# Patient Record
Sex: Female | Born: 1985 | Race: White | Hispanic: No | Marital: Single | Smoking: Former smoker
Health system: Southern US, Community
[De-identification: ages and names within clinical notes are randomized; demographics above are authoritative.]

## PROBLEM LIST (undated history)

## (undated) DIAGNOSIS — F909 Attention-deficit hyperactivity disorder, unspecified type: Secondary | ICD-10-CM

## (undated) DIAGNOSIS — J309 Allergic rhinitis, unspecified: Secondary | ICD-10-CM

## (undated) HISTORY — DX: Allergic rhinitis, unspecified: J30.9

## (undated) HISTORY — DX: Attention-deficit hyperactivity disorder, unspecified type: F90.9

---

## 2005-08-25 ENCOUNTER — Ambulatory Visit: Payer: Self-pay | Admitting: Internal Medicine

## 2014-06-30 ENCOUNTER — Ambulatory Visit
Admit: 2014-06-30 | Disposition: A | Discharge status: Home / Self Care | Payer: Self-pay | Attending: Primary Care | Admitting: Primary Care

## 2014-07-18 ENCOUNTER — Ambulatory Visit
Admit: 2014-07-18 | Disposition: A | Discharge status: Home / Self Care | Payer: Self-pay | Attending: Primary Care | Admitting: Primary Care

## 2014-08-12 ENCOUNTER — Other Ambulatory Visit: Payer: Self-pay | Admitting: Family Medicine

## 2014-08-12 DIAGNOSIS — Z3401 Encounter for supervision of normal first pregnancy, first trimester: Secondary | ICD-10-CM

## 2014-08-13 ENCOUNTER — Ambulatory Visit
Admission: RE | Admit: 2014-08-13 | Discharge: 2014-08-13 | Disposition: A | Discharge status: Home / Self Care | Payer: Self-pay | Source: Ambulatory Visit | Attending: Family Medicine | Admitting: Family Medicine

## 2014-08-13 ENCOUNTER — Ambulatory Visit: Payer: Self-pay

## 2014-08-13 DIAGNOSIS — Z3401 Encounter for supervision of normal first pregnancy, first trimester: Secondary | ICD-10-CM | POA: Insufficient documentation

## 2014-08-13 DIAGNOSIS — O3481 Maternal care for other abnormalities of pelvic organs, first trimester: Secondary | ICD-10-CM | POA: Insufficient documentation

## 2015-12-19 IMAGING — US US OB COMP LESS 14 WK
1 series · 13 of 28 positions shown · non-contrast
Comparison: Pelvic ultrasound dated June 30, 2014

CLINICAL DATA: Unsure dates; spotting and brown discharge for the
past 4 days.

EXAM:
OBSTETRIC <14 WK US AND TRANSVAGINAL OB US
TECHNIQUE: Both transabdominal and transvaginal ultrasound examinations were
performed for complete evaluation of the gestation as well as the
maternal uterus, adnexal regions, and pelvic cul-de-sac.
Transvaginal technique was performed to assess early pregnancy.

[Series 1: us ob comp less 14 wk · 0.23mm/px · 13 of 72 slices shown]
[im 3/72]
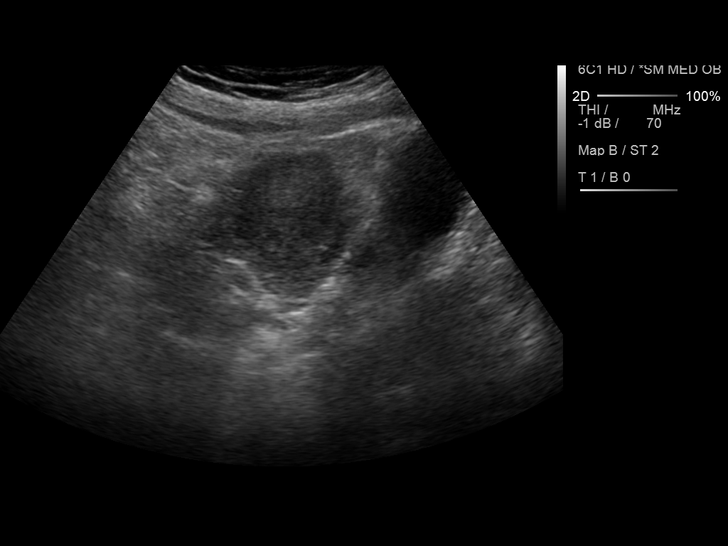
[im 8/72]
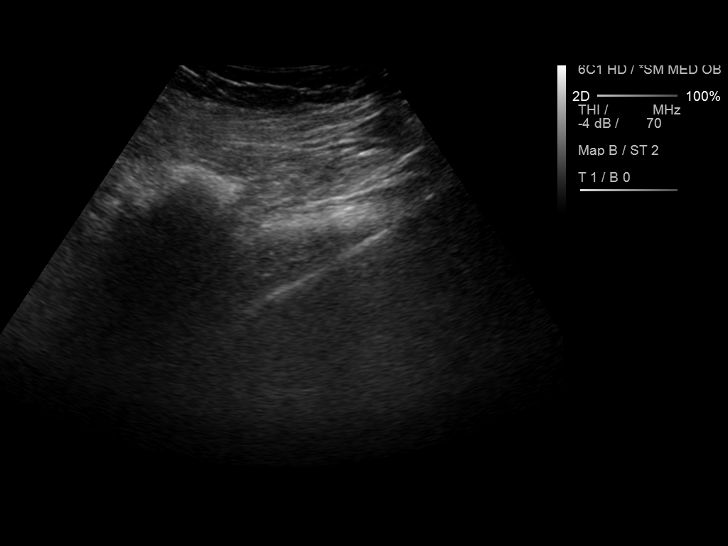
[im 14/72]
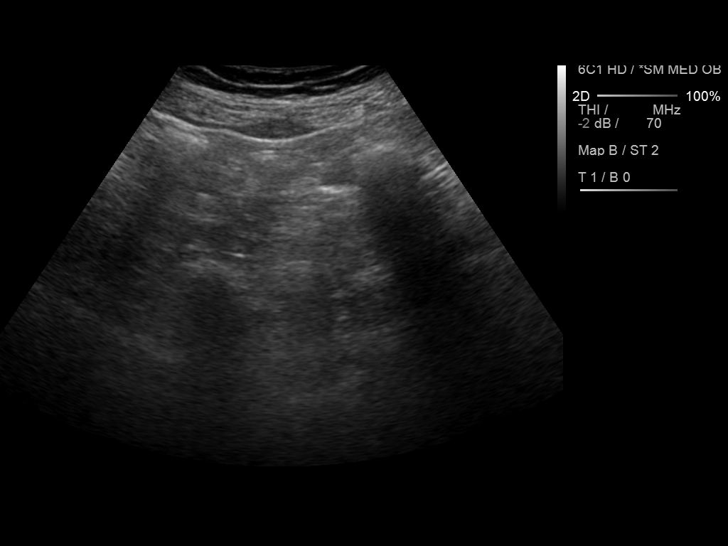
[im 19/72]
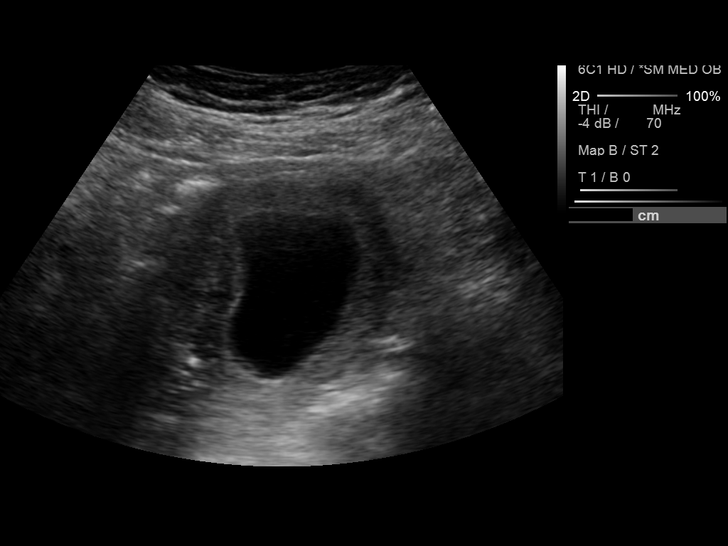
[im 24/72]
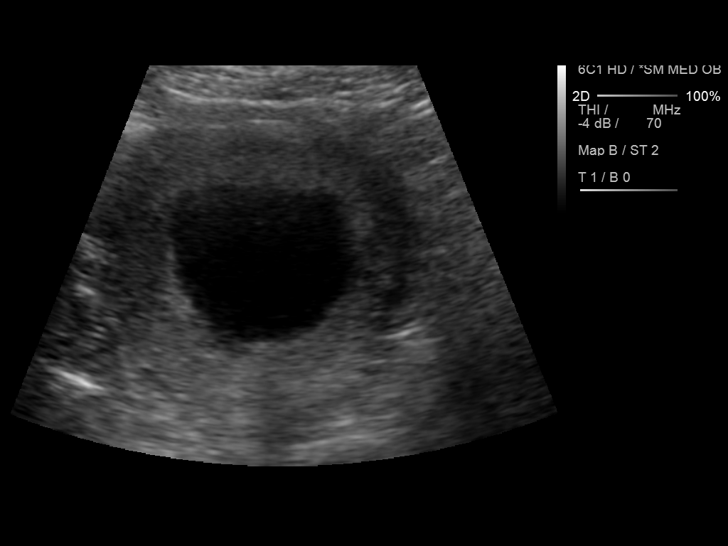
[im 29/72]
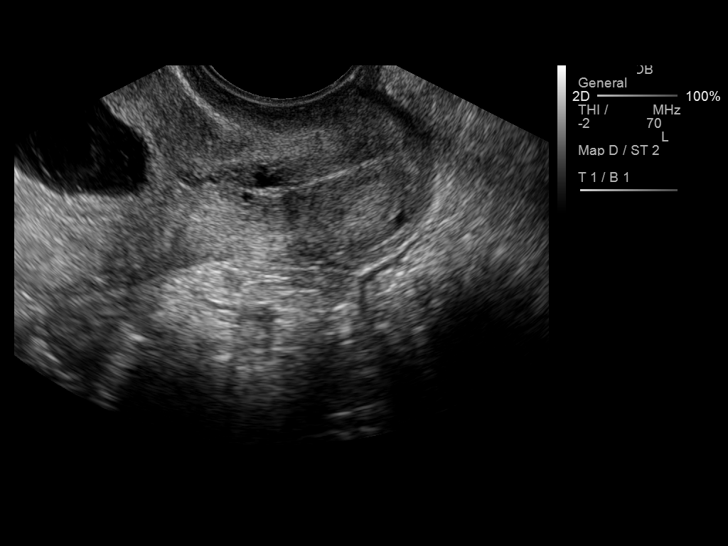
[im 37/72]
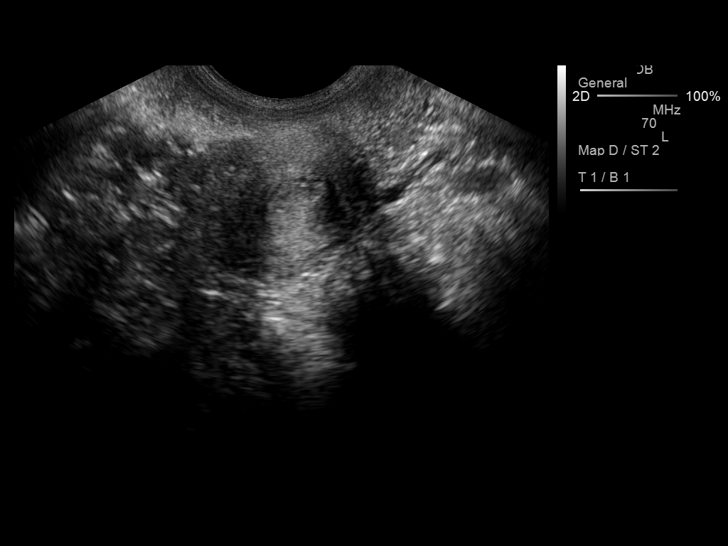
[im 43/72]
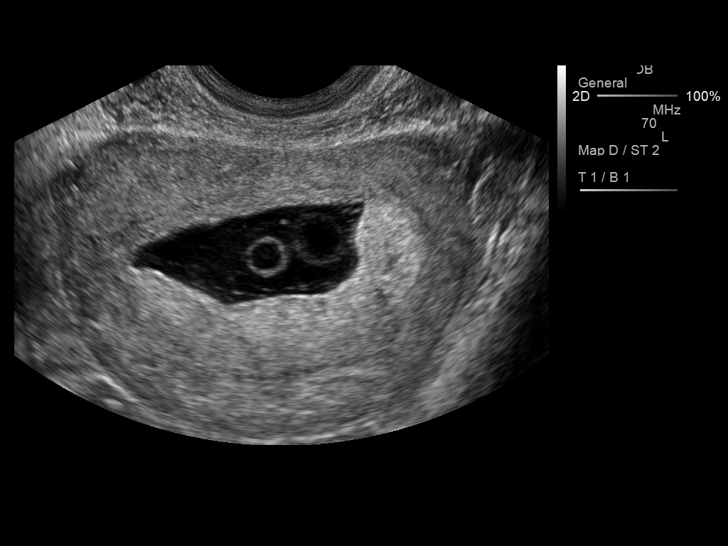
[im 48/72]
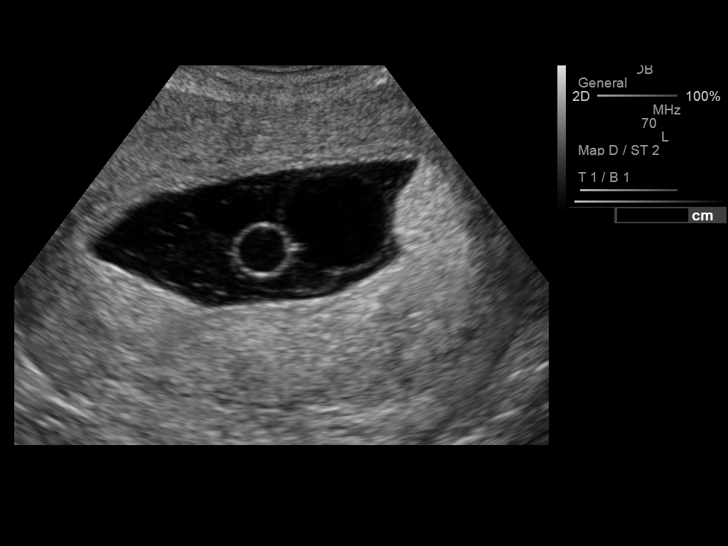
[im 53/72]
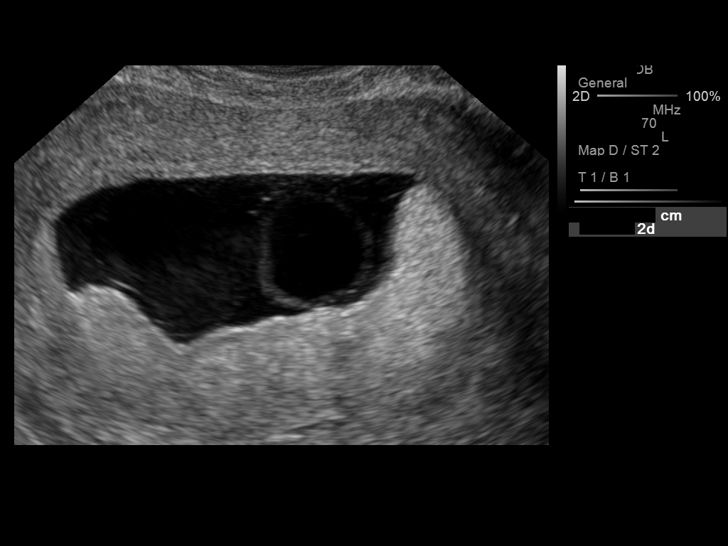
[im 58/72]
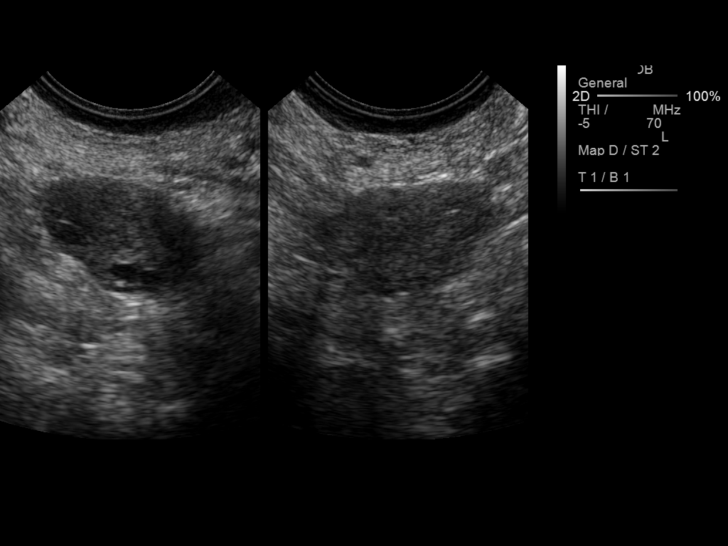
[im 64/72]
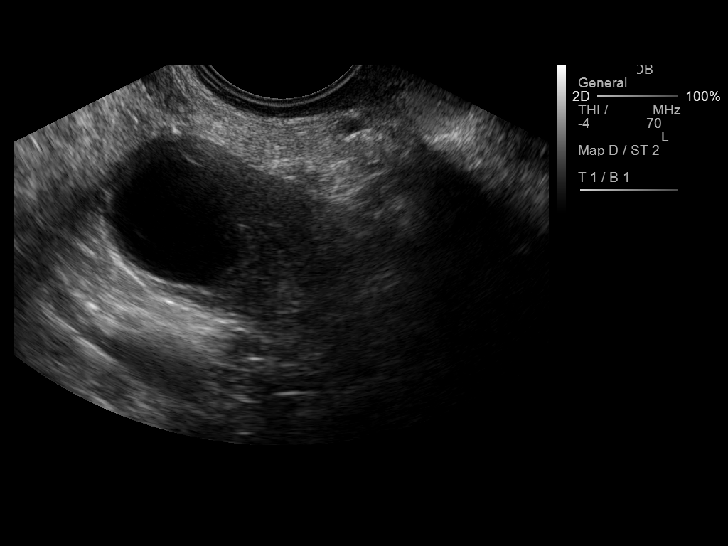
[im 69/72]
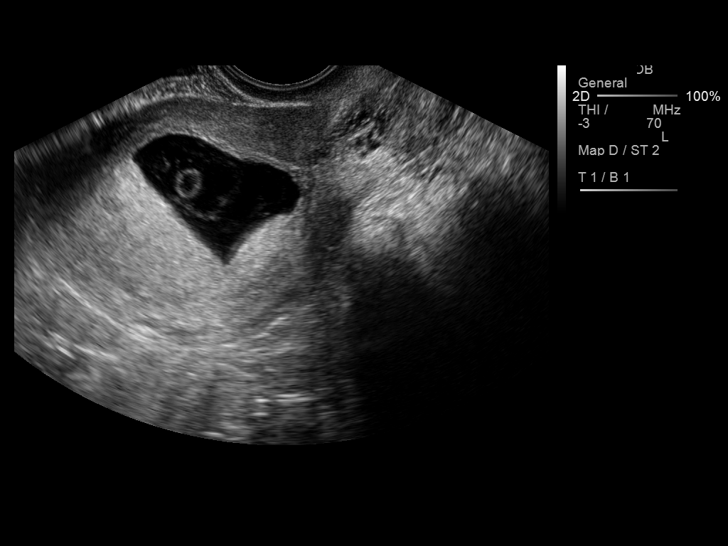

[13 of 28 positions shown; findings below may reference images not displayed]

FINDINGS: Intrauterine gestational sac: Single, somewhat irregular in shape

Yolk sac:  Present

Embryo:  Not visualized

Cardiac Activity: Not visualized

Heart Rate: n/a  bpm

MSD: 3.3  mm   8 w   3  d

US EDC: March 22, 2015

Maternal uterus/adnexae: There is no subchorionic hemorrhage. The
right ovary measures 3.6 x 2.3 x 2.5 cm. It contains a cyst
measuring 2.2 cm in greatest dimension. The left ovary measures
x 1.4 x 2.2 cm. There is no free pelvic fluid.
IMPRESSION: 1. No fetus is demonstrated in the presumed gestational sac. The
estimated gestational age would be approximately 8 weeks 3 days at
which time the fetus should certainly be visible. This may reflect
interval miscarriage since the previous study or may reflect an
anembryonic pregnancy.
2. There is a simple appearing right ovarian cyst. The left ovary is
unremarkable.
3. These results will be called to the ordering clinician or
representative by the [HOSPITAL] at the imaging location.

## 2016-03-21 NOTE — L&D Delivery Note (Signed)
Delivery Note   Carrie Schwartz is a 31 y.o. [id number] at 110w5d Estimated Date of Delivery: 02/17/17  PRE-OPERATIVE DIAGNOSIS:  1) 7310w5d pregnancy.   POST-OPERATIVE DIAGNOSIS:  1) 7510w5d pregnancy s/p Vaginal, Spontaneous Delivery   Delivery Type: Vaginal, Spontaneous Delivery    Delivery Anesthesia: Epidural   Labor Complications:   PPROM    ESTIMATED BLOOD LOSS: 350 ml    FINDINGS:   1) female infant, Apgar scores of 8    at 1 minute and 8    at 5 minutes and a birthweight of 112.17  ounces.    2) Nuchal cord: No  SPECIMENS:   PLACENTA:   Appearance: Intact , 3 vessel cord   Removal: Spontaneous      Disposition:   help per protocol then discarded  DISPOSITION:  Infant to left in stable condition in the delivery room, with L&D personnel and mother,  NARRATIVE SUMMARY: Labor course:  Ms. Carrie Quailnas Rastetter is a [id number] at 2410w5d who presented for induction of labor due to Preterm premature rupture of membranes .  She progressed well in labor with pitocin and cytotec.  She received the appropriate epidural anesthesia and proceeded to complete dilation. She evidenced good maternal expulsive effort during the second stage. She went on to deliver a viable female infant. The placenta delivered without problems and was noted to be complete. A perineal and vaginal examination was performed. Episiotomy/Lacerations: 2nd degree  , right side wall, left labial. The lacerations were repaired with 3-0 Vicryl rapide suture. The patient tolerated this well.  Doreene Burkennie Wilhelmine Krogstad, CNM 01/18/2017 10:30 PM

## 2016-08-05 ENCOUNTER — Encounter: Payer: Self-pay | Admitting: Certified Nurse Midwife

## 2016-08-05 ENCOUNTER — Ambulatory Visit (INDEPENDENT_AMBULATORY_CARE_PROVIDER_SITE_OTHER): Payer: No Typology Code available for payment source | Admitting: Certified Nurse Midwife

## 2016-08-05 VITALS — BP 101/75 | HR 84 | Ht 67.0 in | Wt 121.0 lb

## 2016-08-05 DIAGNOSIS — Z3490 Encounter for supervision of normal pregnancy, unspecified, unspecified trimester: Secondary | ICD-10-CM | POA: Insufficient documentation

## 2016-08-05 DIAGNOSIS — Z3A12 12 weeks gestation of pregnancy: Secondary | ICD-10-CM | POA: Diagnosis not present

## 2016-08-05 DIAGNOSIS — F429 Obsessive-compulsive disorder, unspecified: Secondary | ICD-10-CM | POA: Diagnosis not present

## 2016-08-05 DIAGNOSIS — Z3201 Encounter for pregnancy test, result positive: Secondary | ICD-10-CM | POA: Diagnosis not present

## 2016-08-05 NOTE — Patient Instructions (Addendum)
How a Baby Grows During Pregnancy Pregnancy begins when a female's sperm enters a female's egg (fertilization). This happens in one of the tubes (fallopian tubes) that connect the ovaries to the womb (uterus). The fertilized egg is called an embryo until it reaches 10 weeks. From 10 weeks until birth, it is called a fetus. The fertilized egg moves down the fallopian tube to the uterus. Then it implants into the lining of the uterus and begins to grow. The developing fetus receives oxygen and nutrients through the pregnant woman's bloodstream and the tissues that grow (placenta) to support the fetus. The placenta is the life support system for the fetus. It provides nutrition and removes waste. Learning as much as you can about your pregnancy and how your baby is developing can help you enjoy the experience. It can also make you aware of when there might be a problem and when to ask questions. How long does a typical pregnancy last? A pregnancy usually lasts 280 days, or about 40 weeks. Pregnancy is divided into three trimesters:  First trimester: 0-13 weeks.  Second trimester: 14-27 weeks.  Third trimester: 28-40 weeks. The day when your baby is considered ready to be born (full term) is your estimated date of delivery. How does my baby develop month by month? First month  The fertilized egg attaches to the inside of the uterus.  Some cells will form the placenta. Others will form the fetus.  The arms, legs, brain, spinal cord, lungs, and heart begin to develop.  At the end of the first month, the heart begins to beat. Second month  The bones, inner ear, eyelids, hands, and feet form.  The genitals develop.  By the end of 8 weeks, all major organs are developing. Third month  All of the internal organs are forming.  Teeth develop below the gums.  Bones and muscles begin to grow. The spine can flex.  The skin is transparent.  Fingernails and toenails begin to form.  Arms and  legs continue to grow longer, and hands and feet develop.  The fetus is about 3 in (7.6 cm) long. Fourth month  The placenta is completely formed.  The external sex organs, neck, outer ear, eyebrows, eyelids, and fingernails are formed.  The fetus can hear, swallow, and move its arms and legs.  The kidneys begin to produce urine.  The skin is covered with a white waxy coating (vernix) and very fine hair (lanugo). Fifth month  The fetus moves around more and can be felt for the first time (quickening).  The fetus starts to sleep and wake up and may begin to suck its finger.  The nails grow to the end of the fingers.  The organ in the digestive system that makes bile (gallbladder) functions and helps to digest the nutrients.  If your baby is a girl, eggs are present in her ovaries. If your baby is a boy, testicles start to move down into his scrotum. Sixth month  The lungs are formed, but the fetus is not yet able to breathe.  The eyes open. The brain continues to develop.  Your baby has fingerprints and toe prints. Your baby's hair grows thicker.  At the end of the second trimester, the fetus is about 9 in (22.9 cm) long. Seventh month  The fetus kicks and stretches.  The eyes are developed enough to sense changes in light.  The hands can make a grasping motion.  The fetus responds to sound. Eighth month  All organs and body systems are fully developed and functioning.  Bones harden and taste buds develop. The fetus may hiccup.  Certain areas of the brain are still developing. The skull remains soft. Ninth month  The fetus gains about  lb (0.23 kg) each week.  The lungs are fully developed.  Patterns of sleep develop.  The fetus's head typically moves into a head-down position (vertex) in the uterus to prepare for birth. If the buttocks move into a vertex position instead, the baby is breech.  The fetus weighs 6-9 lbs (2.72-4.08 kg) and is 19-20 in  (48.26-50.8 cm) long. What can I do to have a healthy pregnancy and help my baby develop?  Eating and Drinking  Eat a healthy diet.  Talk with your health care provider to make sure that you are getting the nutrients that you and your baby need.  Visit www.BuildDNA.es to learn about creating a healthy diet.  Gain a healthy amount of weight during pregnancy as advised by your health care provider. This is usually 25-35 pounds. You may need to:  Gain more if you were underweight before getting pregnant or if you are pregnant with more than one baby.  Gain less if you were overweight or obese when you got pregnant. Medicines and Vitamins  Take prenatal vitamins as directed by your health care provider. These include vitamins such as folic acid, iron, calcium, and vitamin D. They are important for healthy development.  Take medicines only as directed by your health care provider. Read labels and ask a pharmacist or your health care provider whether over-the-counter medicines, supplements, and prescription drugs are safe to take during pregnancy. Activities  Be physically active as advised by your health care provider. Ask your health care provider to recommend activities that are safe for you to do, such as walking or swimming.  Do not participate in strenuous or extreme sports. Lifestyle  Do not drink alcohol.  Do not use any tobacco products, including cigarettes, chewing tobacco, or electronic cigarettes. If you need help quitting, ask your health care provider.  Do not use illegal drugs. Safety  Avoid exposure to mercury, lead, or other heavy metals. Ask your health care provider about common sources of these heavy metals.  Avoid listeria infection during pregnancy. Follow these precautions:  Do not eat soft cheeses or deli meats.  Do not eat hot dogs unless they have been warmed up to the point of steaming, such as in the microwave oven.  Do not drink unpasteurized  milk.  Avoid toxoplasmosis infection during pregnancy. Follow these precautions:  Do not change your cat's litter box, if you have a cat. Ask someone else to do this for you.  Wear gardening gloves while working in the yard. General Instructions  Keep all follow-up visits as directed by your health care provider. This is important. This includes prenatal care and screening tests.  Manage any chronic health conditions. Work closely with your health care provider to keep conditions, such as diabetes, under control. How do I know if my baby is developing well? At each prenatal visit, your health care provider will do several different tests to check on your health and keep track of your baby's development. These include:  Fundal height.  Your health care provider will measure your growing belly from top to bottom using a tape measure.  Your health care provider will also feel your belly to determine your baby's position.  Heartbeat.  An ultrasound in the first trimester can  confirm pregnancy and show a heartbeat, depending on how far along you are.  Your health care provider will check your baby's heart rate at every prenatal visit.  As you get closer to your delivery date, you may have regular fetal heart rate monitoring to make sure that your baby is not in distress.  Second trimester ultrasound.  This ultrasound checks your baby's development. It also indicates your baby's gender. What should I do if I have concerns about my baby's development? Always talk with your health care provider about any concerns that you may have. This information is not intended to replace advice given to you by your health care provider. Make sure you discuss any questions you have with your health care provider. Document Released: 08/24/2007 Document Revised: 08/13/2015 Document Reviewed: 08/14/2013 Elsevier Interactive Patient Education  2017 [place]. Common Medications Safe in  Pregnancy  Acne:      Constipation:  Benzoyl Peroxide     Colace  Clindamycin      Dulcolax Suppository  Topica Erythromycin     Fibercon  Salicylic Acid      Metamucil         Miralax AVOID:        Senakot   Accutane    Cough:  Retin-A       Cough Drops  Tetracycline      Phenergan w/ Codeine if Rx  Minocycline      Robitussin (Plain & DM)  Antibiotics:     Crabs/Lice:  Ceclor       RID  Cephalosporins    AVOID:  E-Mycins      Kwell  Keflex  Macrobid/Macrodantin   Diarrhea:  Penicillin      Kao-Pectate  Zithromax      Imodium AD         PUSH FLUIDS AVOID:       Cipro     Fever:  Tetracycline      Tylenol (Regular or Extra  Minocycline       Strength)  Levaquin      Extra Strength-Do not          Exceed 8 tabs/24 hrs Caffeine:        '200mg'$ /day (equiv. To 1 cup of coffee or  approx. 3 12 oz sodas)         Gas: Cold/Hayfever:       Gas-X  Benadryl      Mylicon  Claritin       Phazyme  **Claritin-D        Chlor-Trimeton    Headaches:  Dimetapp      ASA-Free Excedrin  Drixoral-Non-Drowsy     Cold Compress  Mucinex (Guaifenasin)     Tylenol (Regular or Extra  Sudafed/Sudafed-12 Hour     Strength)  **Sudafed PE Pseudoephedrine   Tylenol Cold & Sinus     Vicks Vapor Rub  Zyrtec  **AVOID if Problems With Blood Pressure         Heartburn: Avoid lying down for at least 1 hour after meals  Aciphex      Maalox     Rash:  Milk of Magnesia     Benadryl    Mylanta       1% Hydrocortisone Cream  Pepcid  Pepcid Complete   Sleep Aids:  Prevacid      Ambien   Prilosec       Benadryl  Rolaids       Chamomile Tea  Tums (Limit 4/day)  Unisom  Zantac       Tylenol PM         Warm milk-add vanilla or  Hemorrhoids:       Sugar for taste  Anusol/Anusol H.C.  (RX: Analapram 2.5%)  Sugar Substitutes:  Hydrocortisone OTC     Ok in moderation  Preparation H      Tucks        Vaseline lotion applied to tissue with  wiping    Herpes:     Throat:  Acyclovir      Oragel  Famvir  Valtrex     Vaccines:         Flu Shot Leg Cramps:       *Gardasil  Benadryl      Hepatitis A         Hepatitis B Nasal Spray:       Pneumovax  Saline Nasal Spray     Polio Booster         Tetanus Nausea:       Tuberculosis test or PPD  Vitamin B6 25 mg TID   AVOID:    Dramamine      *Gardasil  Emetrol       Live Poliovirus  Ginger Root 250 mg QID    MMR (measles, mumps &  High Complex Carbs @ Bedtime    rebella)  Sea Bands-Accupressure    Varicella (Chickenpox)  Unisom 1/2 tab TID     *No known complications           If received before Pain:         Known pregnancy;   Darvocet       Resume series after  Lortab        Delivery  Percocet    Yeast:   Tramadol      Femstat  Tylenol 3      Gyne-lotrimin  Ultram       Monistat  Vicodin           MISC:         All Sunscreens           Hair Coloring/highlights          Insect Repellant's          (Including DEET)         Mystic Tans

## 2016-08-05 NOTE — Progress Notes (Signed)
Subjective:    Carrie Schwartz is a 31 y.o. female who presents for evaluation of amenorrhea. She believes she could be pregnant. Pregnancy is desired. Sexual Activity: single partner, contraception: none. Current symptoms also include: breast tenderness and nausea. Last period was normal.   Patient's last menstrual period was 05/13/2016 (exact date). The following portions of the patient's history were reviewed and updated as appropriate: allergies, current medications, past family history, past medical history, past social history, past surgical history and problem list.  Review of Systems Pertinent items are noted in HPI.     Objective:    LMP 05/13/2016 (Exact Date)  General: alert, cooperative, appears stated age and no acute distress    Lab Review Urine HCG: positive    Assessment:    Absence of menstruation.     Plan:    Pregnancy Test:   Positive: EDC: 02/18/16. Briefly discussed pre-natal care options.Encouraged well-balanced diet, plenty of rest when needed, pre-natal vitamins daily and walking for exercise. Discussed self-help for nausea, avoiding OTC medications, reviewed current medications encourage  minimal caffeine (1-2 cups daily) and avoiding alcohol. She will schedule her initial nurse visit ASAP and new OB physical exam next week.  Fetal heart tones today 170.   Doreene BurkeAnnie Alissandra Geoffroy, CNM

## 2016-08-11 ENCOUNTER — Encounter: Payer: Self-pay | Admitting: Obstetrics and Gynecology

## 2016-08-11 ENCOUNTER — Ambulatory Visit (INDEPENDENT_AMBULATORY_CARE_PROVIDER_SITE_OTHER): Payer: No Typology Code available for payment source | Admitting: Obstetrics and Gynecology

## 2016-08-11 ENCOUNTER — Other Ambulatory Visit: Payer: Self-pay | Admitting: Obstetrics and Gynecology

## 2016-08-11 VITALS — BP 88/66 | HR 77 | Wt 121.4 lb

## 2016-08-11 DIAGNOSIS — Z3492 Encounter for supervision of normal pregnancy, unspecified, second trimester: Secondary | ICD-10-CM

## 2016-08-11 DIAGNOSIS — B9689 Other specified bacterial agents as the cause of diseases classified elsewhere: Secondary | ICD-10-CM

## 2016-08-11 DIAGNOSIS — N760 Acute vaginitis: Secondary | ICD-10-CM

## 2016-08-11 MED ORDER — METRONIDAZOLE 500 MG PO TABS: 500.0000 mg | ORAL_TABLET | Freq: Two times a day (BID) | ORAL | 0 refills | Status: DC

## 2016-08-11 NOTE — Progress Notes (Signed)
NEW OB HISTORY AND PHYSICAL  SUBJECTIVE:       Carrie Schwartz is a 31 y.o. [id number] female, Patient's last menstrual period was 05/13/2016 (exact date)., Estimated Date of Delivery: 02/17/17, 7766w6d, presents today for establishment of Prenatal Care. She has no unusual complaints and complains of nausea without vomiting. 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      Gynecologic History Patient's last menstrual period was 05/13/2016 (exact date). Normal Contraception: none Last Pap: 2016. Results were: normal  Obstetric History OB History  Gravida Para Term Preterm AB Living  2       1    SAB TAB Ectopic Multiple Live Births  1            # Outcome Date GA Lbr Len/2nd Weight Sex Delivery Anes PTL Lv  2 Current           1 SAB  1430w0d    SAB         Past Medical History:  Diagnosis Date  . ADHD     History reviewed. No pertinent surgical history.  Current Outpatient Prescriptions on File Prior to Visit  Medication Sig Dispense Refill  . amphetamine-dextroamphetamine (ADDERALL) 20 MG tablet TK 1 T PO TID PRN  0  . Prenatal Vit-Fe Fumarate-FA (MULTIVITAMIN-PRENATAL) 27-0.8 MG TABS tablet Take 1 tablet by mouth daily at 12 noon.     No current facility-administered medications on file prior to visit.     No Known Allergies  Social History   Social History  . Marital status: Single    Spouse name: N/A  . Number of children: N/A  . Years of education: N/A   Occupational History  . Not on file.   Social History Main Topics  . Smoking status: Former Smoker    Quit date: 03/21/2006  . Smokeless tobacco: Never Used  . Alcohol use No  . Drug use: No  . Sexual activity: Yes    Birth control/ protection: None   Other Topics Concern  . Not on file   Social History Narrative  . No narrative on file    Family History  Problem Relation Age of Onset  . Thrombocytopenia Mother   . Hypertension Father   . Hyperlipidemia Father   . Diabetes Neg Hx   . Stroke  Neg Hx     The following portions of the patient's history were reviewed and updated as appropriate: allergies, current medications, past OB history, past medical history, past surgical history, past family history, past social history, and problem list.    OBJECTIVE: Initial Physical Exam (New OB)  GENERAL APPEARANCE: alert, well appearing, in no apparent distress, oriented to person, place and time HEAD: normocephalic, atraumatic MOUTH: mucous membranes moist, pharynx normal without lesions and dental hygiene good THYROID: no thyromegaly or masses present BREASTS: no masses noted, no significant tenderness, no palpable axillary nodes, no skin changes LUNGS: clear to auscultation, no wheezes, rales or rhonchi, symmetric air entry HEART: regular rate and rhythm, no murmurs ABDOMEN: soft, nontender, nondistended, no abnormal masses, no epigastric pain, fundus not palpable and FHT present EXTREMITIES: no redness or tenderness in the calves or thighs SKIN: normal coloration and turgor, no rashes LYMPH NODES: no adenopathy palpable NEUROLOGIC: alert, oriented, normal speech, no focal findings or movement disorder noted  PELVIC EXAM EXTERNAL GENITALIA: normal appearing vulva with no masses, tenderness or lesions VAGINA: no abnormal discharge or lesions CERVIX: no lesions or cervical motion tenderness and discharge thin, green and  frothy,  Microscopic wet-mount exam shows clue cells, excessive bacteria. UTERUS: gravid and consistent with 13 weeks  ASSESSMENT: Normal pregnancy BV ADD  PLAN: Prenatal care Desires genetic screening, will obtained today Treated for BV See orders

## 2016-08-11 NOTE — Progress Notes (Signed)
NOB - pt is doing well,having some nausea declines medication for it

## 2016-08-11 NOTE — Patient Instructions (Addendum)
Second Trimester of Pregnancy The second trimester is from week 14 through week 27 (months 4 through 6). The second trimester is often a time when you feel your best. Your body has adjusted to being pregnant, and you begin to feel better physically. Usually, morning sickness has lessened or quit completely, you may have more energy, and you may have an increase in appetite. The second trimester is also a time when the fetus is growing rapidly. At the end of the sixth month, the fetus is about 9 inches long and weighs about 1 pounds. You will likely begin to feel the baby move (quickening) between 16 and 20 weeks of pregnancy. Body changes during your second trimester Your body continues to go through many changes during your second trimester. The changes vary from woman to woman.  Your weight will continue to increase. You will notice your lower abdomen bulging out.  You may begin to get stretch marks on your hips, abdomen, and breasts.  You may develop headaches that can be relieved by medicines. The medicines should be approved by your health care provider.  You may urinate more often because the fetus is pressing on your bladder.  You may develop or continue to have heartburn as a result of your pregnancy.  You may develop constipation because certain hormones are causing the muscles that push waste through your intestines to slow down.  You may develop hemorrhoids or swollen, bulging veins (varicose veins).  You may have back pain. This is caused by:  Weight gain.  Pregnancy hormones that are relaxing the joints in your pelvis.  A shift in weight and the muscles that support your balance.  Your breasts will continue to grow and they will continue to become tender.  Your gums may bleed and may be sensitive to brushing and flossing.  Dark spots or blotches (chloasma, mask of pregnancy) may develop on your face. This will likely fade after the baby is born.  A dark line from your  belly button to the pubic area (linea nigra) may appear. This will likely fade after the baby is born.  You may have changes in your hair. These can include thickening of your hair, rapid growth, and changes in texture. Some women also have hair loss during or after pregnancy, or hair that feels dry or thin. Your hair will most likely return to normal after your baby is born. What to expect at prenatal visits During a routine prenatal visit:  You will be weighed to make sure you and the fetus are growing normally.  Your blood pressure will be taken.  Your abdomen will be measured to track your baby's growth.  The fetal heartbeat will be listened to.  Any test results from the previous visit will be discussed. Your health care provider may ask you:  How you are feeling.  If you are feeling the baby move.  If you have had any abnormal symptoms, such as leaking fluid, bleeding, severe headaches, or abdominal cramping.  If you are using any tobacco products, including cigarettes, chewing tobacco, and electronic cigarettes.  If you have any questions. Other tests that may be performed during your second trimester include:  Blood tests that check for:  Low iron levels (anemia).  High blood sugar that affects pregnant women (gestational diabetes) between 24 and 28 weeks.  Rh antibodies. This is to check for a protein on red blood cells (Rh factor).  Urine tests to check for infections, diabetes, or protein in the   urine.  An ultrasound to confirm the proper growth and development of the baby.  An amniocentesis to check for possible genetic problems.  Fetal screens for spina bifida and Down syndrome.  HIV (human immunodeficiency virus) testing. Routine prenatal testing includes screening for HIV, unless you choose not to have this test. Follow these instructions at home: Medicines   Follow your health care provider's instructions regarding medicine use. Specific medicines may  be either safe or unsafe to take during pregnancy.  Take a prenatal vitamin that contains at least 600 micrograms (mcg) of folic acid.  If you develop constipation, try taking a stool softener if your health care provider approves. Eating and drinking   Eat a balanced diet that includes fresh fruits and vegetables, whole grains, good sources of protein such as meat, eggs, or tofu, and low-fat dairy. Your health care provider will help you determine the amount of weight gain that is right for you.  Avoid raw meat and uncooked cheese. These carry germs that can cause birth defects in the baby.  If you have low calcium intake from food, talk to your health care provider about whether you should take a daily calcium supplement.  Limit foods that are high in fat and processed sugars, such as fried and sweet foods.  To prevent constipation:  Drink enough fluid to keep your urine clear or pale yellow.  Eat foods that are high in fiber, such as fresh fruits and vegetables, whole grains, and beans. Activity   Exercise only as directed by your health care provider. Most women can continue their usual exercise routine during pregnancy. Try to exercise for 30 minutes at least 5 days a week. Stop exercising if you experience uterine contractions.  Avoid heavy lifting, wear low heel shoes, and practice good posture.  A sexual relationship may be continued unless your health care provider directs you otherwise. Relieving pain and discomfort   Wear a good support bra to prevent discomfort from breast tenderness.  Take warm sitz baths to soothe any pain or discomfort caused by hemorrhoids. Use hemorrhoid cream if your health care provider approves.  Rest with your legs elevated if you have leg cramps or low back pain.  If you develop varicose veins, wear support hose. Elevate your feet for 15 minutes, 3-4 times a day. Limit salt in your diet. Prenatal Care   Write down your questions. Take them  to your prenatal visits.  Keep all your prenatal visits as told by your health care provider. This is important. Safety   Wear your seat belt at all times when driving.  Make a list of emergency phone numbers, including numbers for family, friends, the hospital, and police and fire departments. General instructions   Ask your health care provider for a referral to a local prenatal education class. Begin classes no later than the beginning of month 6 of your pregnancy.  Ask for help if you have counseling or nutritional needs during pregnancy. Your health care provider can offer advice or refer you to specialists for help with various needs.  Do not use hot tubs, steam rooms, or saunas.  Do not douche or use tampons or scented sanitary pads.  Do not cross your legs for long periods of time.  Avoid cat litter boxes and soil used by cats. These carry germs that can cause birth defects in the baby and possibly loss of the fetus by miscarriage or stillbirth.  Avoid all smoking, herbs, alcohol, and unprescribed drugs. Chemicals in   these products can affect the formation and growth of the baby.  Do not use any products that contain nicotine or tobacco, such as cigarettes and e-cigarettes. If you need help quitting, ask your health care provider.  Visit your dentist if you have not gone yet during your pregnancy. Use a soft toothbrush to brush your teeth and be gentle when you floss. Contact a health care provider if:  You have dizziness.  You have mild pelvic cramps, pelvic pressure, or nagging pain in the abdominal area.  You have persistent nausea, vomiting, or diarrhea.  You have a bad smelling vaginal discharge.  You have pain when you urinate. Get help right away if:  You have a fever.  You are leaking fluid from your vagina.  You have spotting or bleeding from your vagina.  You have severe abdominal cramping or pain.  You have rapid weight gain or weight loss.  You  have shortness of breath with chest pain.  You notice sudden or extreme swelling of your face, hands, ankles, feet, or legs.  You have not felt your baby move in over an hour.  You have severe headaches that do not go away when you take medicine.  You have vision changes. Summary  The second trimester is from week 14 through week 27 (months 4 through 6). It is also a time when the fetus is growing rapidly.  Your body goes through many changes during pregnancy. The changes vary from woman to woman.  Avoid all smoking, herbs, alcohol, and unprescribed drugs. These chemicals affect the formation and growth your baby.  Do not use any tobacco products, such as cigarettes, chewing tobacco, and e-cigarettes. If you need help quitting, ask your health care provider.  Contact your health care provider if you have any questions. Keep all prenatal visits as told by your health care provider. This is important. This information is not intended to replace advice given to you by your health care provider. Make sure you discuss any questions you have with your health care provider. Document Released: 03/01/2001 Document Revised: 08/13/2015 Document Reviewed: 05/08/2012 Elsevier Interactive Patient Education  2017 [employer]    Bacterial Vaginosis Bacterial vaginosis is a vaginal infection that occurs when the normal balance of bacteria in the vagina is disrupted. It results from an overgrowth of certain bacteria. This is the most common vaginal infection among women ages 59-44. Because bacterial vaginosis increases your risk for STIs (sexually transmitted infections), getting treated can help reduce your risk for chlamydia, gonorrhea, herpes, and HIV (human immunodeficiency virus). Treatment is also important for preventing complications in pregnant women, because this condition can cause an early (premature) delivery. What are the causes? This condition is caused by an increase in harmful  bacteria that are normally present in small amounts in the vagina. However, the reason that the condition develops is not fully understood. What increases the risk? The following factors may make you more likely to develop this condition:  Having a new sexual partner or multiple sexual partners.  Having unprotected sex.  Douching.  Having an intrauterine device (IUD).  Smoking.  Drug and alcohol abuse.  Taking certain antibiotic medicines.  Being pregnant. You cannot get bacterial vaginosis from toilet seats, bedding, swimming pools, or contact with objects around you. What are the signs or symptoms? Symptoms of this condition include:  Grey or white vaginal discharge. The discharge can also be watery or foamy.  A fish-like odor with discharge, especially after sexual intercourse or during  menstruation.  Itching in and around the vagina.  Burning or pain with urination. Some women with bacterial vaginosis have no signs or symptoms. How is this diagnosed? This condition is diagnosed based on:  Your medical history.  A physical exam of the vagina.  Testing a sample of vaginal fluid under a microscope to look for a large amount of bad bacteria or abnormal cells. Your health care provider may use a cotton swab or a small wooden spatula to collect the sample. How is this treated? This condition is treated with antibiotics. These may be given as a pill, a vaginal cream, or a medicine that is put into the vagina (suppository). If the condition comes back after treatment, a second round of antibiotics may be needed. Follow these instructions at home: Medicines   Take over-the-counter and prescription medicines only as told by your health care provider.  Take or use your antibiotic as told by your health care provider. Do not stop taking or using the antibiotic even if you start to feel better. General instructions   If you have a female sexual partner, tell her that you have  a vaginal infection. She should see her health care provider and be treated if she has symptoms. If you have a female sexual partner, he does not need treatment.  During treatment:  Avoid sexual activity until you finish treatment.  Do not douche.  Avoid alcohol as directed by your health care provider.  Avoid breastfeeding as directed by your health care provider.  Drink enough water and fluids to keep your urine clear or pale yellow.  Keep the area around your vagina and rectum clean.  Wash the area daily with warm water.  Wipe yourself from front to back after using the toilet.  Keep all follow-up visits as told by your health care provider. This is important. How is this prevented?  Do not douche.  Wash the outside of your vagina with warm water only.  Use protection when having sex. This includes latex condoms and dental dams.  Limit how many sexual partners you have. To help prevent bacterial vaginosis, it is best to have sex with just one partner (monogamous).  Make sure you and your sexual partner are tested for STIs.  Wear cotton or cotton-lined underwear.  Avoid wearing tight pants and pantyhose, especially during summer.  Limit the amount of alcohol that you drink.  Do not use any products that contain nicotine or tobacco, such as cigarettes and e-cigarettes. If you need help quitting, ask your health care provider.  Do not use illegal drugs. Where to find more information:  Centers for Disease Control and Prevention: SolutionApps.co.zawww.cdc.gov/std  American Sexual Health Association (ASHA): www.ashastd.org  [place] Department of Health and Health and safety inspectorHuman Services, Office on Women's Health: ConventionalMedicines.siwww.womenshealth.gov/ or http://www.anderson-williamson.info/https://www.womenshealth.gov/a-z-topics/bacterial-vaginosis Contact a health care provider if:  Your symptoms do not improve, even after treatment.  You have more discharge or pain when urinating.  You have a fever.  You have pain in your abdomen.  You have pain  during sex.  You have vaginal bleeding between periods. Summary  Bacterial vaginosis is a vaginal infection that occurs when the normal balance of bacteria in the vagina is disrupted.  Because bacterial vaginosis increases your risk for STIs (sexually transmitted infections), getting treated can help reduce your risk for chlamydia, gonorrhea, herpes, and HIV (human immunodeficiency virus). Treatment is also important for preventing complications in pregnant women, because the condition can cause an early (premature) delivery.  This condition is  treated with antibiotic medicines. These may be given as a pill, a vaginal cream, or a medicine that is put into the vagina (suppository). This information is not intended to replace advice given to you by your health care provider. Make sure you discuss any questions you have with your health care provider. Document Released: 03/07/2005 Document Revised: 11/21/2015 Document Reviewed: 11/21/2015 Elsevier Interactive Patient Education  2017 [employer].

## 2016-08-12 LAB — CBC WITH DIFFERENTIAL/PLATELET
BASOS ABS: 0 10*3/uL (ref 0.0–0.2)
Basos: 0 %
EOS (ABSOLUTE): 0.2 10*3/uL (ref 0.0–0.4)
Eos: 3 %
HEMOGLOBIN: 11.8 g/dL (ref 11.1–15.9)
Hematocrit: 35.3 % (ref 34.0–46.6)
IMMATURE GRANS (ABS): 0 10*3/uL (ref 0.0–0.1)
IMMATURE GRANULOCYTES: 1 %
LYMPHS: 27 %
Lymphocytes Absolute: 1.6 10*3/uL (ref 0.7–3.1)
MCH: 27 pg (ref 26.6–33.0)
MCHC: 33.4 g/dL (ref 31.5–35.7)
MCV: 81 fL (ref 79–97)
MONOCYTES: 5 %
Monocytes Absolute: 0.3 10*3/uL (ref 0.1–0.9)
NEUTROS PCT: 64 %
Neutrophils Absolute: 3.9 10*3/uL (ref 1.4–7.0)
PLATELETS: 180 10*3/uL (ref 150–379)
RBC: 4.37 x10E6/uL (ref 3.77–5.28)
RDW: 14.1 % (ref 12.3–15.4)
WBC: 6.1 10*3/uL (ref 3.4–10.8)

## 2016-08-12 LAB — MONITOR DRUG PROFILE 14(MW)
AMPHETAMINE SCREEN URINE: NEGATIVE ng/mL
BARBITURATE SCREEN URINE: NEGATIVE ng/mL
BENZODIAZEPINE SCREEN, URINE: NEGATIVE ng/mL
BUPRENORPHINE, URINE: NEGATIVE ng/mL
CANNABINOIDS UR QL SCN: NEGATIVE ng/mL
Cocaine (Metab) Scrn, Ur: NEGATIVE ng/mL
Creatinine(Crt), U: 54.9 mg/dL (ref 20.0–300.0)
Fentanyl, Urine: NEGATIVE pg/mL
MEPERIDINE SCREEN, URINE: NEGATIVE ng/mL
METHADONE SCREEN, URINE: NEGATIVE ng/mL
OXYCODONE+OXYMORPHONE UR QL SCN: NEGATIVE ng/mL
Opiate Scrn, Ur: NEGATIVE ng/mL
PH UR, DRUG SCRN: 7.2 (ref 4.5–8.9)
PHENCYCLIDINE QUANTITATIVE URINE: NEGATIVE ng/mL
PROPOXYPHENE SCREEN URINE: NEGATIVE ng/mL
SPECIFIC GRAVITY: 1.02
Tramadol Screen, Urine: NEGATIVE ng/mL

## 2016-08-12 LAB — URINALYSIS, ROUTINE W REFLEX MICROSCOPIC
BILIRUBIN UA: NEGATIVE
Glucose, UA: NEGATIVE
KETONES UA: NEGATIVE
LEUKOCYTES UA: NEGATIVE
Nitrite, UA: NEGATIVE
PROTEIN UA: NEGATIVE
RBC UA: NEGATIVE
SPEC GRAV UA: 1.015 (ref 1.005–1.030)
Urobilinogen, Ur: 0.2 mg/dL (ref 0.2–1.0)
pH, UA: 7.5 (ref 5.0–7.5)

## 2016-08-12 LAB — RUBELLA SCREEN: Rubella Antibodies, IGG: 5.01 index (ref >0.99)

## 2016-08-12 LAB — HEPATITIS B SURFACE ANTIGEN: HEP B S AG: NEGATIVE

## 2016-08-12 LAB — HIV ANTIBODY (ROUTINE TESTING W REFLEX): HIV SCREEN 4TH GENERATION: NONREACTIVE

## 2016-08-12 LAB — VARICELLA ZOSTER ANTIBODY, IGG: Varicella zoster IgG: 477 index (ref >165)

## 2016-08-12 LAB — ABO AND RH: RH TYPE: POSITIVE

## 2016-08-12 LAB — RPR: RPR Ser Ql: NONREACTIVE

## 2016-08-12 LAB — ANTIBODY SCREEN: ANTIBODY SCREEN: NEGATIVE

## 2016-08-12 LAB — CYTOLOGY - PAP

## 2016-08-13 LAB — URINE CULTURE: Organism ID, Bacteria: NO GROWTH

## 2016-08-14 LAB — GC/CHLAMYDIA PROBE AMP
Chlamydia trachomatis, NAA: NEGATIVE
Neisseria gonorrhoeae by PCR: NEGATIVE

## 2016-09-12 ENCOUNTER — Encounter: Payer: Self-pay | Admitting: Certified Nurse Midwife

## 2016-09-12 ENCOUNTER — Ambulatory Visit (INDEPENDENT_AMBULATORY_CARE_PROVIDER_SITE_OTHER): Payer: No Typology Code available for payment source | Admitting: Certified Nurse Midwife

## 2016-09-12 VITALS — BP 94/70 | HR 76 | Wt 125.3 lb

## 2016-09-12 DIAGNOSIS — Z3689 Encounter for other specified antenatal screening: Secondary | ICD-10-CM

## 2016-09-12 DIAGNOSIS — Z3492 Encounter for supervision of normal pregnancy, unspecified, second trimester: Secondary | ICD-10-CM

## 2016-09-12 DIAGNOSIS — Z87440 Personal history of urinary (tract) infections: Secondary | ICD-10-CM

## 2016-09-12 LAB — POCT URINALYSIS DIPSTICK
Bilirubin, UA: NEGATIVE
Glucose, UA: NEGATIVE
Ketones, UA: NEGATIVE
Leukocytes, UA: NEGATIVE
Nitrite, UA: NEGATIVE
PH UA: 7 (ref 5.0–8.0)
PROTEIN UA: NEGATIVE
Spec Grav, UA: 1.01 (ref 1.010–1.025)
UROBILINOGEN UA: 0.2 U/dL

## 2016-09-12 NOTE — Progress Notes (Signed)
Pt is here for an OB visit.No c/o 

## 2016-09-12 NOTE — Progress Notes (Signed)
ROB-Pt doing well, no questions or concerns. Anticipatory guidance regarding round ligament pain and home treatment measures. Pt requests repeat urine culture due to history of UTI and frequent urination. Reviewed red flag symptoms and when to call. RTC x 3 weeks for anatomy scan and ROB.

## 2016-09-12 NOTE — Patient Instructions (Signed)

## 2016-09-23 ENCOUNTER — Encounter: Payer: Self-pay | Admitting: Obstetrics and Gynecology

## 2016-09-27 ENCOUNTER — Other Ambulatory Visit: Payer: Self-pay | Admitting: Obstetrics and Gynecology

## 2016-09-27 MED ORDER — HYDROCORTISONE ACETATE 25 MG RE SUPP: 25.0000 mg | Freq: Two times a day (BID) | RECTAL | 1 refills | Status: DC

## 2016-10-03 ENCOUNTER — Ambulatory Visit (INDEPENDENT_AMBULATORY_CARE_PROVIDER_SITE_OTHER): Payer: No Typology Code available for payment source | Admitting: Certified Nurse Midwife

## 2016-10-03 ENCOUNTER — Ambulatory Visit (INDEPENDENT_AMBULATORY_CARE_PROVIDER_SITE_OTHER): Payer: No Typology Code available for payment source

## 2016-10-03 VITALS — BP 100/74 | HR 76 | Wt 134.1 lb

## 2016-10-03 DIAGNOSIS — Z3689 Encounter for other specified antenatal screening: Secondary | ICD-10-CM

## 2016-10-03 DIAGNOSIS — Z3482 Encounter for supervision of other normal pregnancy, second trimester: Secondary | ICD-10-CM

## 2016-10-03 DIAGNOSIS — Z3492 Encounter for supervision of normal pregnancy, unspecified, second trimester: Secondary | ICD-10-CM

## 2016-10-03 LAB — POCT URINALYSIS DIPSTICK
Bilirubin, UA: NEGATIVE
Glucose, UA: NEGATIVE
KETONES UA: NEGATIVE
Leukocytes, UA: NEGATIVE
Nitrite, UA: NEGATIVE
PH UA: 8 (ref 5.0–8.0)
PROTEIN UA: NEGATIVE
Urobilinogen, UA: 0.2 E.U./dL

## 2016-10-03 NOTE — Patient Instructions (Signed)
Hemorrhoids Hemorrhoids are swollen veins in and around the rectum or anus. There are two types of hemorrhoids:  Internal hemorrhoids. These occur in the veins that are just inside the rectum. They may poke through to the outside and become irritated and painful.  External hemorrhoids. These occur in the veins that are outside of the anus and can be felt as a painful swelling or hard lump near the anus.  Most hemorrhoids do not cause serious problems, and they can be managed with home treatments such as diet and lifestyle changes. If home treatments do not help your symptoms, procedures can be done to shrink or remove the hemorrhoids. What are the causes? This condition is caused by increased pressure in the anal area. This pressure may result from various things, including:  Constipation.  Straining to have a bowel movement.  Diarrhea.  Pregnancy.  Obesity.  Sitting for long periods of time.  Heavy lifting or other activity that causes you to strain.  Anal sex.  What are the signs or symptoms? Symptoms of this condition include:  Pain.  Anal itching or irritation.  Rectal bleeding.  Leakage of stool (feces).  Anal swelling.  One or more lumps around the anus.  How is this diagnosed? This condition can often be diagnosed through a visual exam. Other exams or tests may also be done, such as:  Examination of the rectal area with a gloved hand (digital rectal exam).  Examination of the anal canal using a small tube (anoscope).  A blood test, if you have lost a significant amount of blood.  A test to look inside the colon (sigmoidoscopy or colonoscopy).  How is this treated? This condition can usually be treated at home. However, various procedures may be done if dietary changes, lifestyle changes, and other home treatments do not help your symptoms. These procedures can help make the hemorrhoids smaller or remove them completely. Some of these procedures involve  surgery, and others do not. Common procedures include:  Rubber band ligation. Rubber bands are placed at the base of the hemorrhoids to cut off the blood supply to them.  Sclerotherapy. Medicine is injected into the hemorrhoids to shrink them.  Infrared coagulation. A type of light energy is used to get rid of the hemorrhoids.  Hemorrhoidectomy surgery. The hemorrhoids are surgically removed, and the veins that supply them are tied off.  Stapled hemorrhoidopexy surgery. A circular stapling device is used to remove the hemorrhoids and use staples to cut off the blood supply to them.  Follow these instructions at home: Eating and drinking  Eat foods that have a lot of fiber in them, such as whole grains, beans, nuts, fruits, and vegetables. Ask your health care provider about taking products that have added fiber (fiber supplements).  Drink enough fluid to keep your urine clear or pale yellow. Managing pain and swelling  Take warm sitz baths for 20 minutes, 3-4 times a day to ease pain and discomfort.  If directed, apply ice to the affected area. Using ice packs between sitz baths may be helpful. ? Put ice in a plastic bag. ? Place a towel between your skin and the bag. ? Leave the ice on for 20 minutes, 2-3 times a day. General instructions  Take over-the-counter and prescription medicines only as told by your health care provider.  Use medicated creams or suppositories as told.  Exercise regularly.  Go to the bathroom when you have the urge to have a bowel movement. Do not wait.    Avoid straining to have bowel movements.  Keep the anal area dry and clean. Use wet toilet paper or moist towelettes after a bowel movement.  Do not sit on the toilet for long periods of time. This increases blood pooling and pain. Contact a health care provider if:  You have increasing pain and swelling that are not controlled by treatment or medicine.  You have uncontrolled bleeding.  You  have difficulty having a bowel movement, or you are unable to have a bowel movement.  You have pain or inflammation outside the area of the hemorrhoids. This information is not intended to replace advice given to you by your health care provider. Make sure you discuss any questions you have with your health care provider. Document Released: 03/04/2000 Document Revised: 08/05/2015 Document Reviewed: 11/19/2014 Elsevier Interactive Patient Education  2017 [employer] Round Ligament Pain The round ligament is a cord of muscle and tissue that helps to support the uterus. It can become a source of pain during pregnancy if it becomes stretched or twisted as the baby grows. The pain usually begins in the second trimester of pregnancy, and it can come and go until the baby is delivered. It is not a serious problem, and it does not cause harm to the baby. Round ligament pain is usually a short, sharp, and pinching pain, but it can also be a dull, lingering, and aching pain. The pain is felt in the lower side of the abdomen or in the groin. It usually starts deep in the groin and moves up to the outside of the hip area. Pain can occur with:  A sudden change in position.  Rolling over in bed.  Coughing or sneezing.  Physical activity.  Follow these instructions at home: Watch your condition for any changes. Take these steps to help with your pain:  When the pain starts, relax. Then try: ? Sitting down. ? Flexing your knees up to your abdomen. ? Lying on your side with one pillow under your abdomen and another pillow between your legs. ? Sitting in a warm bath for 15-20 minutes or until the pain goes away.  Take over-the-counter and prescription medicines only as told by your health care provider.  Move slowly when you sit and stand.  Avoid long walks if they cause pain.  Stop or lessen your physical activities if they cause pain.  Contact a health care provider if:  Your pain does not go  away with treatment.  You feel pain in your back that you did not have before.  Your medicine is not helping. Get help right away if:  You develop a fever or chills.  You develop uterine contractions.  You develop vaginal bleeding.  You develop nausea or vomiting.  You develop diarrhea.  You have pain when you urinate. This information is not intended to replace advice given to you by your health care provider. Make sure you discuss any questions you have with your health care provider. Document Released: 12/15/2007 Document Revised: 08/13/2015 Document Reviewed: 05/14/2014 Elsevier Interactive Patient Education  2018 [employer]. How a Baby Grows During Pregnancy Pregnancy begins when a female's sperm enters a female's egg (fertilization). This happens in one of the tubes (fallopian tubes) that connect the ovaries to the womb (uterus). The fertilized egg is called an embryo until it reaches 10 weeks. From 10 weeks until birth, it is called a fetus. The fertilized egg moves down the fallopian tube to the uterus. Then it implants into the  lining of the uterus and begins to grow. The developing fetus receives oxygen and nutrients through the pregnant woman's bloodstream and the tissues that grow (placenta) to support the fetus. The placenta is the life support system for the fetus. It provides nutrition and removes waste. Learning as much as you can about your pregnancy and how your baby is developing can help you enjoy the experience. It can also make you aware of when there might be a problem and when to ask questions. How long does a typical pregnancy last? A pregnancy usually lasts 280 days, or about 40 weeks. Pregnancy is divided into three trimesters:  First trimester: 0-13 weeks.  Second trimester: 14-27 weeks.  Third trimester: 28-40 weeks.  The day when your baby is considered ready to be born (full term) is your estimated date of delivery. How does my baby develop month by  month? First month  The fertilized egg attaches to the inside of the uterus.  Some cells will form the placenta. Others will form the fetus.  The arms, legs, brain, spinal cord, lungs, and heart begin to develop.  At the end of the first month, the heart begins to beat.  Second month  The bones, inner ear, eyelids, hands, and feet form.  The genitals develop.  By the end of 8 weeks, all major organs are developing.  Third month  All of the internal organs are forming.  Teeth develop below the gums.  Bones and muscles begin to grow. The spine can flex.  The skin is transparent.  Fingernails and toenails begin to form.  Arms and legs continue to grow longer, and hands and feet develop.  The fetus is about 3 in (7.6 cm) long.  Fourth month  The placenta is completely formed.  The external sex organs, neck, outer ear, eyebrows, eyelids, and fingernails are formed.  The fetus can hear, swallow, and move its arms and legs.  The kidneys begin to produce urine.  The skin is covered with a white waxy coating (vernix) and very fine hair (lanugo).  Fifth month  The fetus moves around more and can be felt for the first time (quickening).  The fetus starts to sleep and wake up and may begin to suck its finger.  The nails grow to the end of the fingers.  The organ in the digestive system that makes bile (gallbladder) functions and helps to digest the nutrients.  If your baby is a girl, eggs are present in her ovaries. If your baby is a boy, testicles start to move down into his scrotum.  Sixth month  The lungs are formed, but the fetus is not yet able to breathe.  The eyes open. The brain continues to develop.  Your baby has fingerprints and toe prints. Your baby's hair grows thicker.  At the end of the second trimester, the fetus is about 9 in (22.9 cm) long.  Seventh month  The fetus kicks and stretches.  The eyes are developed enough to sense changes in  light.  The hands can make a grasping motion.  The fetus responds to sound.  Eighth month  All organs and body systems are fully developed and functioning.  Bones harden and taste buds develop. The fetus may hiccup.  Certain areas of the brain are still developing. The skull remains soft.  Ninth month  The fetus gains about  lb (0.23 kg) each week.  The lungs are fully developed.  Patterns of sleep develop.  The fetus's head  typically moves into a head-down position (vertex) in the uterus to prepare for birth. If the buttocks move into a vertex position instead, the baby is breech.  The fetus weighs 6-9 lbs (2.72-4.08 kg) and is 19-20 in (48.26-50.8 cm) long.  What can I do to have a healthy pregnancy and help my baby develop? Eating and Drinking  Eat a healthy diet. ? Talk with your health care provider to make sure that you are getting the nutrients that you and your baby need. ? Visit www.[employer].be to learn about creating a healthy diet.  Gain a healthy amount of weight during pregnancy as advised by your health care provider. This is usually 25-35 pounds. You may need to: ? Gain more if you were underweight before getting pregnant or if you are pregnant with more than one baby. ? Gain less if you were overweight or obese when you got pregnant.  Medicines and Vitamins  Take prenatal vitamins as directed by your health care provider. These include vitamins such as folic acid, iron, calcium, and vitamin D. They are important for healthy development.  Take medicines only as directed by your health care provider. Read labels and ask a pharmacist or your health care provider whether over-the-counter medicines, supplements, and prescription drugs are safe to take during pregnancy.  Activities  Be physically active as advised by your health care provider. Ask your health care provider to recommend activities that are safe for you to do, such as walking or  swimming.  Do not participate in strenuous or extreme sports.  Lifestyle  Do not drink alcohol.  Do not use any tobacco products, including cigarettes, chewing tobacco, or electronic cigarettes. If you need help quitting, ask your health care provider.  Do not use illegal drugs.  Safety  Avoid exposure to mercury, lead, or other heavy metals. Ask your health care provider about common sources of these heavy metals.  Avoid listeria infection during pregnancy. Follow these precautions: ? Do not eat soft cheeses or deli meats. ? Do not eat hot dogs unless they have been warmed up to the point of steaming, such as in the microwave oven. ? Do not drink unpasteurized milk.  Avoid toxoplasmosis infection during pregnancy. Follow these precautions: ? Do not change your cat's litter box, if you have a cat. Ask someone else to do this for you. ? Wear gardening gloves while working in the yard.  General Instructions  Keep all follow-up visits as directed by your health care provider. This is important. This includes prenatal care and screening tests.  Manage any chronic health conditions. Work closely with your health care provider to keep conditions, such as diabetes, under control.  How do I know if my baby is developing well? At each prenatal visit, your health care provider will do several different tests to check on your health and keep track of your baby's development. These include:  Fundal height. ? Your health care provider will measure your growing belly from top to bottom using a tape measure. ? Your health care provider will also feel your belly to determine your baby's position.  Heartbeat. ? An ultrasound in the first trimester can confirm pregnancy and show a heartbeat, depending on how far along you are. ? Your health care provider will check your baby's heart rate at every prenatal visit. ? As you get closer to your delivery date, you may have regular fetal heart rate  monitoring to make sure that your baby is not in distress.  Second trimester ultrasound. ? This ultrasound checks your baby's development. It also indicates your baby's gender.  What should I do if I have concerns about my baby's development? Always talk with your health care provider about any concerns that you may have. This information is not intended to replace advice given to you by your health care provider. Make sure you discuss any questions you have with your health care provider. Document Released: 08/24/2007 Document Revised: 08/13/2015 Document Reviewed: 08/14/2013 Elsevier Interactive Patient Education  [employer].

## 2016-10-03 NOTE — Progress Notes (Signed)
ROB, doing well. She has hemorrhoids, reviewed self help management options. Pharmacy is supposed to be contacting us for prior authorization for her hydrocortisone cream. She said she will contact pharmacy today to clarify. She is feeling fetal movement. Denies contractions. Reviewed anatomy U/S results with her. See below. Follow up in 4 wks.   Doreene BurkeAnnie Donell Tomkins, CNM  ULTRASOUND REPORT  Location: [facility] Date of Service: 10/03/16  Indications:Anatomy U/S Findings:  Mason JimSingleton intrauterine pregnancy is visualized with FHR at 149 BPM. Biometrics give an (U/S) Gestational age of 32 1/7 weeks and an (U/S) EDD of 02/05/17; this correlates with the clinically established EDD of 02/17/17.  Fetal presentation is Vertex.  EFW: 494g (1lb 1oz). Placenta: Anterior, grade 0, 4.6 cm from internal os, central cord insert. AFI: Adequate with MVP of 5.1cm.  Anatomic survey is complete and normal; Gender - female  .   Ovaries are not seen Survey of the adnexa demonstrates no adnexal masses. There is no free peritoneal fluid in the cul de sac.  Impression: 1. 22 1/7 week Viable Singleton Intrauterine pregnancy by U/S. 2. (U/S) EDD is consistent with Clinically established (LMP) EDD of 02/17/17. 3. Normal Anatomy Scan  Recommendations: 1.Clinical correlation with the patient's History and Physical Exam.   Boyce MediciMaria E Hill

## 2016-10-13 ENCOUNTER — Encounter: Payer: Self-pay | Admitting: Certified Nurse Midwife

## 2016-11-01 ENCOUNTER — Ambulatory Visit (INDEPENDENT_AMBULATORY_CARE_PROVIDER_SITE_OTHER): Payer: No Typology Code available for payment source | Admitting: Obstetrics and Gynecology

## 2016-11-01 VITALS — BP 107/75 | HR 88 | Wt 142.9 lb

## 2016-11-01 DIAGNOSIS — Z3492 Encounter for supervision of normal pregnancy, unspecified, second trimester: Secondary | ICD-10-CM

## 2016-11-01 LAB — POCT URINALYSIS DIPSTICK
BILIRUBIN UA: NEGATIVE
Glucose, UA: NEGATIVE
KETONES UA: NEGATIVE
Leukocytes, UA: NEGATIVE
Nitrite, UA: NEGATIVE
Protein, UA: NEGATIVE
RBC UA: NEGATIVE
SPEC GRAV UA: 1.01 (ref 1.010–1.025)
UROBILINOGEN UA: 0.2 U/dL
pH, UA: 6 (ref 5.0–8.0)

## 2016-11-01 NOTE — Progress Notes (Signed)
ROB- states moderate lower abdomen cramping since waking up this morning, notes 3 episodes since being in the room-20 minutes. Had normal BM this morning and voiding without changes. Denies spotting or recent sex. FFN and NuSwab obtained. Placed on NST monitor for contractions and noted 2 30sec contractions in 35 minutes. Stated they felt less while on monitor. Discussed PTL s/s and precautions. glucola next visit.

## 2016-11-01 NOTE — Progress Notes (Signed)
ROB- pt is doing well 

## 2016-11-02 LAB — FETAL FIBRONECTIN: Fetal Fibronectin: NEGATIVE

## 2016-11-05 LAB — NUSWAB VAGINITIS PLUS (VG+)
CANDIDA ALBICANS, NAA: NEGATIVE
Candida glabrata, NAA: NEGATIVE
Chlamydia trachomatis, NAA: NEGATIVE
NEISSERIA GONORRHOEAE, NAA: NEGATIVE
TRICH VAG BY NAA: NEGATIVE

## 2016-11-29 ENCOUNTER — Ambulatory Visit (INDEPENDENT_AMBULATORY_CARE_PROVIDER_SITE_OTHER): Payer: No Typology Code available for payment source | Admitting: Certified Nurse Midwife

## 2016-11-29 ENCOUNTER — Other Ambulatory Visit: Payer: No Typology Code available for payment source

## 2016-11-29 VITALS — BP 102/71 | HR 66 | Wt 149.9 lb

## 2016-11-29 DIAGNOSIS — Z131 Encounter for screening for diabetes mellitus: Secondary | ICD-10-CM

## 2016-11-29 DIAGNOSIS — O99619 Diseases of the digestive system complicating pregnancy, unspecified trimester: Secondary | ICD-10-CM

## 2016-11-29 DIAGNOSIS — Z23 Encounter for immunization: Secondary | ICD-10-CM

## 2016-11-29 DIAGNOSIS — K219 Gastro-esophageal reflux disease without esophagitis: Secondary | ICD-10-CM

## 2016-11-29 DIAGNOSIS — Z3483 Encounter for supervision of other normal pregnancy, third trimester: Secondary | ICD-10-CM

## 2016-11-29 DIAGNOSIS — Z130 Encounter for screening for diseases of the blood and blood-forming organs and certain disorders involving the immune mechanism: Secondary | ICD-10-CM

## 2016-11-29 LAB — POCT URINALYSIS DIPSTICK
Bilirubin, UA: NEGATIVE
Blood, UA: NEGATIVE
Glucose, UA: NEGATIVE
Ketones, UA: NEGATIVE
Leukocytes, UA: NEGATIVE
Nitrite, UA: NEGATIVE
PROTEIN UA: NEGATIVE
Spec Grav, UA: 1.01 (ref 1.010–1.025)
UROBILINOGEN UA: 0.2 U/dL
pH, UA: 7 (ref 5.0–8.0)

## 2016-11-29 NOTE — Patient Instructions (Addendum)
Heartburn During Pregnancy Heartburn is pain or discomfort in the throat or chest. It may cause a burning feeling. It happens when stomach acid moves up into the tube that carries food from your mouth to your stomach (esophagus). Heartburn is common during pregnancy. It usually goes away or gets better after giving birth. Follow these instructions at home: Eating and drinking  Do not drink alcohol while you are pregnant.  Figure out which foods and beverages make you feel worse, and avoid them.  Beverages that you may want to avoid include: ? Coffee and tea (with or without caffeine). ? Energy drinks and sports drinks. ? Bubbly (carbonated) drinks or sodas. ? Citrus fruit juices.  Foods that you may want to avoid include: ? Chocolate and cocoa. ? Peppermint and mint flavorings. ? Garlic, onions, and horseradish. ? Spicy and acidic foods. These include peppers, chili powder, curry powder, vinegar, hot sauces, and barbecue sauce. ? Citrus fruits, such as oranges, lemons, and limes. ? Tomato-based foods, such as red sauce, chili, and salsa. ? Fried and fatty foods, such as donuts, french fries, potato chips, and high-fat dressings. ? High-fat meats, such as hot dogs, cold cuts, sausage, ham, and bacon. ? High-fat dairy items, such as whole milk, butter, and cheese.  Eat small meals often, instead of large meals.  Avoid drinking a lot of liquid with your meals.  Avoid eating meals during the 2-3 hours before you go to bed.  Avoid lying down right after you eat.  Do not exercise right after you eat. Medicines  Take over-the-counter and prescription medicines only as told by your doctor.  Do not take aspirin, ibuprofen, or other NSAIDs unless your doctor tells you to do that.  Your doctor may tell you to avoid medicines that have sodium bicarbonate in them. General instructions  If told, raise the head of your bed about 6 inches (15 cm). You can do this by putting blocks under  the legs. Sleeping with more pillows does not help with heartburn.  Do not use any products that contain nicotine or tobacco, such as cigarettes and e-cigarettes. If you need help quitting, ask your doctor.  Wear loose-fitting clothing.  Try to lower your stress, such as with yoga or meditation. If you need help, ask your doctor.  Stay at a healthy weight. If you are overweight, work with your doctor to safely lose weight.  Keep all follow-up visits as told by your doctor. This is important. Contact a doctor if:  You get new symptoms.  Your symptoms do not get better with treatment.  You have weight loss and you do not know why.  You have trouble swallowing.  You make loud sounds when you breathe (wheeze).  You have a cough that does not go away.  You have heartburn often for more than 2 weeks.  You feel sick to your stomach (nauseous), and this does not get better with treatment.  You are throwing up (vomiting), and this does not get better with treatment.  You have pain in your belly (abdomen). Get help right away if:  You have very bad chest pain that spreads to your arm, neck, or jaw.  You feel sweaty, dizzy, or light-headed.  You have trouble breathing.  You have pain when swallowing.  You throw up and your throw-up looks like blood or coffee grounds.  Your poop (stool) is bloody or black. This information is not intended to replace advice given to you by your health care provider.   Make sure you discuss any questions you have with your health care provider. Document Released: 04/09/2010 Document Revised: 11/23/2015 Document Reviewed: 11/23/2015 Elsevier Interactive Patient Education  2017 [employer] Cord Blood Banking Information Cord blood banking is the process of collecting and storing the blood that is in the umbilical cord and placenta at the time of delivery. This blood contains stem cells, which can be used to treat many blood diseases, immune system  disorders, and childhood cancers. Stem cells can also be used to research certain diseases and treatments. Many people who choose cord blood banking donate the blood. Donated blood can be used in lifesaving treatments or for research. Other people choose to store the blood privately. Blood that is stored privately can only be used with the person's permission. This option is often chosen if:  A family member needs a stem cell transplant.  The child is part of an ethnic minority.  The child was conceived through in vitro fertilization.  What should I look for in a blood bank? A blood bank is the organization that coordinates cord blood banking. Make sure the cord blood bank that you use:  Is accredited.  Is financially stable.  Handles a large volume of cord blood samples.  Has a procedure in place for transport and storage.  Allows you the option of transferring your cord blood sample.  Has a procedure in place if the bank goes out of business.  Clearly states all costs and limits to future costs.  People who choose to donate cord blood should not need to pay for blood banking. People who keep the blood for private use will need to pay for the first (initial) storage and pay a fee each year (annual fee). Other fees may also apply. What are the risks of cord blood banking? There are no health risks associated with cord blood banking. It is considered safe. How should I prepare? You must schedule this process at least 4-6 weeks before you will be giving birth. How is the blood collected? The blood is collected as soon as the baby has been delivered. Within 15 minutes of delivery, a health care provider will take these actions to collect the blood:  Clamp the umbilical cord at the top and bottom. This traps the blood in the umbilical cord.  Use a syringe or bag to collect the blood.  Insert needles into the placenta to collect (draw out) more blood.  What happens after the blood  is collected? After the blood has been collected:  The blood will be sent to a blood bank.  The blood will be tested for genetic problems and infectious diseases. If the blood tests positive for a genetic problem or a disease, someone will contact you and let you know.  The blood will be frozen.  If your child develops a genetic condition, immune system disorder, or cancer, you will be responsible for contacting the blood bank and letting them know. This information is not intended to replace advice given to you by your health care provider. Make sure you discuss any questions you have with your health care provider. Document Released: 08/25/2009 Document Revised: 08/13/2015 Document Reviewed: 08/25/2014 Elsevier Interactive Patient Education  2018 [employer]. Breastfeeding Deciding to breastfeed is one of the best choices you can make for you and your baby. A change in hormones during pregnancy causes your breast tissue to grow and increases the number and size of your milk ducts. These hormones also allow proteins, sugars, and  fats from your blood supply to make breast milk in your milk-producing glands. Hormones prevent breast milk from being released before your baby is born as well as prompt milk flow after birth. Once breastfeeding has begun, thoughts of your baby, as well as his or her sucking or crying, can stimulate the release of milk from your milk-producing glands. Benefits of breastfeeding For Your Baby  Your first milk (colostrum) helps your baby's digestive system function better.  There are antibodies in your milk that help your baby fight off infections.  Your baby has a lower incidence of asthma, allergies, and sudden infant death syndrome.  The nutrients in breast milk are better for your baby than infant formulas and are designed uniquely for your baby's needs.  Breast milk improves your baby's brain development.  Your baby is less likely to develop other  conditions, such as childhood obesity, asthma, or type 2 diabetes mellitus.  For You  Breastfeeding helps to create a very special bond between you and your baby.  Breastfeeding is convenient. Breast milk is always available at the correct temperature and costs nothing.  Breastfeeding helps to burn calories and helps you lose the weight gained during pregnancy.  Breastfeeding makes your uterus contract to its prepregnancy size faster and slows bleeding (lochia) after you give birth.  Breastfeeding helps to lower your risk of developing type 2 diabetes mellitus, osteoporosis, and breast or ovarian cancer later in life.  Signs that your baby is hungry Early Signs of Hunger  Increased alertness or activity.  Stretching.  Movement of the head from side to side.  Movement of the head and opening of the mouth when the corner of the mouth or cheek is stroked (rooting).  Increased sucking sounds, smacking lips, cooing, sighing, or squeaking.  Hand-to-mouth movements.  Increased sucking of fingers or hands.  Late Signs of Hunger  Fussing.  Intermittent crying.  Extreme Signs of Hunger Signs of extreme hunger will require calming and consoling before your baby will be able to breastfeed successfully. Do not wait for the following signs of extreme hunger to occur before you initiate breastfeeding:  Restlessness.  A loud, strong cry.  Screaming.  Breastfeeding basics Breastfeeding Initiation  Find a comfortable place to sit or lie down, with your neck and back well supported.  Place a pillow or rolled up blanket under your baby to bring him or her to the level of your breast (if you are seated). Nursing pillows are specially designed to help support your arms and your baby while you breastfeed.  Make sure that your baby's abdomen is facing your abdomen.  Gently massage your breast. With your fingertips, massage from your chest wall toward your nipple in a circular motion.  This encourages milk flow. You may need to continue this action during the feeding if your milk flows slowly.  Support your breast with 4 fingers underneath and your thumb above your nipple. Make sure your fingers are well away from your nipple and your baby's mouth.  Stroke your baby's lips gently with your finger or nipple.  When your baby's mouth is open wide enough, quickly bring your baby to your breast, placing your entire nipple and as much of the colored area around your nipple (areola) as possible into your baby's mouth. ? More areola should be visible above your baby's upper lip than below the lower lip. ? Your baby's tongue should be between his or her lower gum and your breast.  Ensure that your baby's  mouth is correctly positioned around your nipple (latched). Your baby's lips should create a seal on your breast and be turned out (everted).  It is common for your baby to suck about 2-3 minutes in order to start the flow of breast milk.  Latching Teaching your baby how to latch on to your breast properly is very important. An improper latch can cause nipple pain and decreased milk supply for you and poor weight gain in your baby. Also, if your baby is not latched onto your nipple properly, he or she may swallow some air during feeding. This can make your baby fussy. Burping your baby when you switch breasts during the feeding can help to get rid of the air. However, teaching your baby to latch on properly is still the best way to prevent fussiness from swallowing air while breastfeeding. Signs that your baby has successfully latched on to your nipple:  Silent tugging or silent sucking, without causing you pain.  Swallowing heard between every 3-4 sucks.  Muscle movement above and in front of his or her ears while sucking.  Signs that your baby has not successfully latched on to nipple:  Sucking sounds or smacking sounds from your baby while breastfeeding.  Nipple pain.  If  you think your baby has not latched on correctly, slip your finger into the corner of your baby's mouth to break the suction and place it between your baby's gums. Attempt breastfeeding initiation again. Signs of Successful Breastfeeding Signs from your baby:  A gradual decrease in the number of sucks or complete cessation of sucking.  Falling asleep.  Relaxation of his or her body.  Retention of a small amount of milk in his or her mouth.  Letting go of your breast by himself or herself.  Signs from you:  Breasts that have increased in firmness, weight, and size 1-3 hours after feeding.  Breasts that are softer immediately after breastfeeding.  Increased milk volume, as well as a change in milk consistency and color by the fifth day of breastfeeding.  Nipples that are not sore, cracked, or bleeding.  Signs That Your Pecola Leisure is Getting Enough Milk  Wetting at least 1-2 diapers during the first 24 hours after birth.  Wetting at least 5-6 diapers every 24 hours for the first week after birth. The urine should be clear or pale yellow by 5 days after birth.  Wetting 6-8 diapers every 24 hours as your baby continues to grow and develop.  At least 3 stools in a 24-hour period by age 30 days. The stool should be soft and yellow.  At least 3 stools in a 24-hour period by age 68 days. The stool should be seedy and yellow.  No loss of weight greater than 10% of birth weight during the first 73 days of age.  Average weight gain of 4-7 ounces (113-198 g) per week after age 38 days.  Consistent daily weight gain by age 30 days, without weight loss after the age of 2 weeks.  After a feeding, your baby may spit up a small amount. This is common. Breastfeeding frequency and duration Frequent feeding will help you make more milk and can prevent sore nipples and breast engorgement. Breastfeed when you feel the need to reduce the fullness of your breasts or when your baby shows signs of hunger. This  is called "breastfeeding on demand." Avoid introducing a pacifier to your baby while you are working to establish breastfeeding (the first 4-6 weeks after your baby  is born). After this time you may choose to use a pacifier. Research has shown that pacifier use during the first year of a baby's life decreases the risk of sudden infant death syndrome (SIDS). Allow your baby to feed on each breast as long as he or she wants. Breastfeed until your baby is finished feeding. When your baby unlatches or falls asleep while feeding from the first breast, offer the second breast. Because newborns are often sleepy in the first few weeks of life, you may need to awaken your baby to get him or her to feed. Breastfeeding times will vary from baby to baby. However, the following rules can serve as a guide to help you ensure that your baby is properly fed:  Newborns (babies 2 weeks of age or younger) may breastfeed every 1-3 hours.  Newborns should not go longer than 3 hours during the day or 5 hours during the night without breastfeeding.  You should breastfeed your baby a minimum of 8 times in a 24-hour period until you begin to introduce solid foods to your baby at around 71 months of age.  Breast milk pumping Pumping and storing breast milk allows you to ensure that your baby is exclusively fed your breast milk, even at times when you are unable to breastfeed. This is especially important if you are going back to work while you are still breastfeeding or when you are not able to be present during feedings. Your lactation consultant can give you guidelines on how long it is safe to store breast milk. A breast pump is a machine that allows you to pump milk from your breast into a sterile bottle. The pumped breast milk can then be stored in a refrigerator or freezer. Some breast pumps are operated by hand, while others use electricity. Ask your lactation consultant which type will work best for you. Breast pumps can be  purchased, but some hospitals and breastfeeding support groups lease breast pumps on a monthly basis. A lactation consultant can teach you how to hand express breast milk, if you prefer not to use a pump. Caring for your breasts while you breastfeed Nipples can become dry, cracked, and sore while breastfeeding. The following recommendations can help keep your breasts moisturized and healthy:  Avoid using soap on your nipples.  Wear a supportive bra. Although not required, special nursing bras and tank tops are designed to allow access to your breasts for breastfeeding without taking off your entire bra or top. Avoid wearing underwire-style bras or extremely tight bras.  Air dry your nipples for 3-27minutes after each feeding.  Use only cotton bra pads to absorb leaked breast milk. Leaking of breast milk between feedings is normal.  Use lanolin on your nipples after breastfeeding. Lanolin helps to maintain your skin's normal moisture barrier. If you use pure lanolin, you do not need to wash it off before feeding your baby again. Pure lanolin is not toxic to your baby. You may also hand express a few drops of breast milk and gently massage that milk into your nipples and allow the milk to air dry.  In the first few weeks after giving birth, some women experience extremely full breasts (engorgement). Engorgement can make your breasts feel heavy, warm, and tender to the touch. Engorgement peaks within 3-5 days after you give birth. The following recommendations can help ease engorgement:  Completely empty your breasts while breastfeeding or pumping. You may want to start by applying warm, moist heat (in the shower  or with warm water-soaked hand towels) just before feeding or pumping. This increases circulation and helps the milk flow. If your baby does not completely empty your breasts while breastfeeding, pump any extra milk after he or she is finished.  Wear a snug bra (nursing or regular) or tank  top for 1-2 days to signal your body to slightly decrease milk production.  Apply ice packs to your breasts, unless this is too uncomfortable for you.  Make sure that your baby is latched on and positioned properly while breastfeeding.  If engorgement persists after 48 hours of following these recommendations, contact your health care provider or a Advertising copywriter. Overall health care recommendations while breastfeeding  Eat healthy foods. Alternate between meals and snacks, eating 3 of each per day. Because what you eat affects your breast milk, some of the foods may make your baby more irritable than usual. Avoid eating these foods if you are sure that they are negatively affecting your baby.  Drink milk, fruit juice, and water to satisfy your thirst (about 10 glasses a day).  Rest often, relax, and continue to take your prenatal vitamins to prevent fatigue, stress, and anemia.  Continue breast self-awareness checks.  Avoid chewing and smoking tobacco. Chemicals from cigarettes that pass into breast milk and exposure to secondhand smoke may harm your baby.  Avoid alcohol and drug use, including marijuana. Some medicines that may be harmful to your baby can pass through breast milk. It is important to ask your health care provider before taking any medicine, including all over-the-counter and prescription medicine as well as vitamin and herbal supplements. It is possible to become pregnant while breastfeeding. If birth control is desired, ask your health care provider about options that will be safe for your baby. Contact a health care provider if:  You feel like you want to stop breastfeeding or have become frustrated with breastfeeding.  You have painful breasts or nipples.  Your nipples are cracked or bleeding.  Your breasts are red, tender, or warm.  You have a swollen area on either breast.  You have a fever or chills.  You have nausea or vomiting.  You have drainage  other than breast milk from your nipples.  Your breasts do not become full before feedings by the fifth day after you give birth.  You feel sad and depressed.  Your baby is too sleepy to eat well.  Your baby is having trouble sleeping.  Your baby is wetting less than 3 diapers in a 24-hour period.  Your baby has less than 3 stools in a 24-hour period.  Your baby's skin or the white part of his or her eyes becomes yellow.  Your baby is not gaining weight by 73 days of age. Get help right away if:  Your baby is overly tired (lethargic) and does not want to wake up and feed.  Your baby develops an unexplained fever. This information is not intended to replace advice given to you by your health care provider. Make sure you discuss any questions you have with your health care provider. Document Released: 03/07/2005 Document Revised: 08/19/2015 Document Reviewed: 08/29/2012 Elsevier Interactive Patient Education  2017 [employer]. Tdap Vaccine (Tetanus, Diphtheria and Pertussis): What You Need to Know 1. Why get vaccinated? Tetanus, diphtheria and pertussis are very serious diseases. Tdap vaccine can protect [place] from these diseases. And, Tdap vaccine given to pregnant women can protect newborn babies against pertussis. TETANUS (Lockjaw) is rare in the [place] today. It  causes painful muscle tightening and stiffness, usually all over the body.  It can lead to tightening of muscles in the head and neck so you can't open your mouth, swallow, or sometimes even breathe. Tetanus kills about 1 out of 10 people who are infected even after receiving the best medical care.  DIPHTHERIA is also rare in the [place] today. It can cause a thick coating to form in the back of the throat.  It can lead to breathing problems, heart failure, paralysis, and death.  PERTUSSIS (Whooping Cough) causes severe coughing spells, which can cause difficulty breathing, vomiting and disturbed sleep.  It  can also lead to weight loss, incontinence, and rib fractures. Up to 2 in 100 adolescents and 5 in 100 adults with pertussis are hospitalized or have complications, which could include pneumonia or death.  These diseases are caused by bacteria. Diphtheria and pertussis are spread from person to person through secretions from coughing or sneezing. Tetanus enters the body through cuts, scratches, or wounds. Before vaccines, as many as 200,000 cases of diphtheria, 200,000 cases of pertussis, and hundreds of cases of tetanus, were reported in the [place] each year. Since vaccination began, reports of cases for tetanus and diphtheria have dropped by about 99% and for pertussis by about 80%. 2. Tdap vaccine Tdap vaccine can protect adolescents and adults from tetanus, diphtheria, and pertussis. One dose of Tdap is routinely given at age 36 or 37. People who did not get Tdap at that age should get it as soon as possible. Tdap is especially important for healthcare professionals and anyone having close contact with a baby younger than 12 months. Pregnant women should get a dose of Tdap during every pregnancy, to protect the newborn from pertussis. Infants are most at risk for severe, life-threatening complications from pertussis. Another vaccine, called Td, protects against tetanus and diphtheria, but not pertussis. A Td booster should be given every 10 years. Tdap may be given as one of these boosters if you have never gotten Tdap before. Tdap may also be given after a severe cut or burn to prevent tetanus infection. Your doctor or the person giving you the vaccine can give you more information. Tdap may safely be given at the same time as other vaccines. 3. Some people should not get this vaccine  A person who has ever had a life-threatening allergic reaction after a previous dose of any diphtheria, tetanus or pertussis containing vaccine, OR has a severe allergy to any part of this vaccine, should not  get Tdap vaccine. Tell the person giving the vaccine about any severe allergies.  Anyone who had coma or long repeated seizures within 7 days after a childhood dose of DTP or DTaP, or a previous dose of Tdap, should not get Tdap, unless a cause other than the vaccine was found. They can still get Td.  Talk to your doctor if you: ? have seizures or another nervous system problem, ? had severe pain or swelling after any vaccine containing diphtheria, tetanus or pertussis, ? ever had a condition called Guillain-Barr Syndrome (GBS), ? aren't feeling well on the day the shot is scheduled. 4. Risks With any medicine, including vaccines, there is a chance of side effects. These are usually mild and go away on their own. Serious reactions are also possible but are rare. Most people who get Tdap vaccine do not have any problems with it. Mild problems following Tdap: (Did not interfere with activities)  Pain where the shot  was given (about 3 in 4 adolescents or 2 in 3 adults)  Redness or swelling where the shot was given (about 1 person in 5)  Mild fever of at least 100.88F (up to about 1 in 25 adolescents or 1 in 100 adults)  Headache (about 3 or 4 people in 10)  Tiredness (about 1 person in 3 or 4)  Nausea, vomiting, diarrhea, stomach ache (up to 1 in 4 adolescents or 1 in 10 adults)  Chills, sore joints (about 1 person in 10)  Body aches (about 1 person in 3 or 4)  Rash, swollen glands (uncommon)  Moderate problems following Tdap: (Interfered with activities, but did not require medical attention)  Pain where the shot was given (up to 1 in 5 or 6)  Redness or swelling where the shot was given (up to about 1 in 16 adolescents or 1 in 12 adults)  Fever over 102F (about 1 in 100 adolescents or 1 in 250 adults)  Headache (about 1 in 7 adolescents or 1 in 10 adults)  Nausea, vomiting, diarrhea, stomach ache (up to 1 or 3 people in 100)  Swelling of the entire arm where the shot  was given (up to about 1 in 500).  Severe problems following Tdap: (Unable to perform usual activities; required medical attention)  Swelling, severe pain, bleeding and redness in the arm where the shot was given (rare).  Problems that could happen after any vaccine:  People sometimes faint after a medical procedure, including vaccination. Sitting or lying down for about 15 minutes can help prevent fainting, and injuries caused by a fall. Tell your doctor if you feel dizzy, or have vision changes or ringing in the ears.  Some people get severe pain in the shoulder and have difficulty moving the arm where a shot was given. This happens very rarely.  Any medication can cause a severe allergic reaction. Such reactions from a vaccine are very rare, estimated at fewer than 1 in a million doses, and would happen within a few minutes to a few hours after the vaccination. As with any medicine, there is a very remote chance of a vaccine causing a serious injury or death. The safety of vaccines is always being monitored. For more information, visit: http://floyd.org/ 5. What if there is a serious problem? What should I look for? Look for anything that concerns you, such as signs of a severe allergic reaction, very high fever, or unusual behavior. Signs of a severe allergic reaction can include hives, swelling of the face and throat, difficulty breathing, a fast heartbeat, dizziness, and weakness. These would usually start a few minutes to a few hours after the vaccination. What should I do?  If you think it is a severe allergic reaction or other emergency that can't wait, call 9-1-1 or get the person to the nearest hospital. Otherwise, call your doctor.  Afterward, the reaction should be reported to the Vaccine Adverse Event Reporting System (VAERS). Your doctor might file this report, or you can do it yourself through the VAERS web site at www.vaers.LAgents.no, or by calling  1-(559)603-2575. ? VAERS does not give medical advice. 6. The National Vaccine Injury Compensation Program The [employer] Vaccine Injury Compensation Program (VICP) is a federal program that was created to compensate people who may have been injured by certain vaccines. Persons who believe they may have been injured by a vaccine can learn about the program and about filing a claim by calling 1-(754) 751-0042 or visiting the VICP website at  SpiritualWord.at. There is a time limit to file a claim for compensation. 7. How can I learn more?  Ask your doctor. He or she can give you the vaccine package insert or suggest other sources of information.  Call your local or state health department.  Contact the Centers for Disease Control and Prevention (CDC): ? Call 360 577 7329 (1-800-CDC-INFO) or ? Visit CDC's website at PicCapture.uy CDC Tdap Vaccine VIS (05/14/13) This information is not intended to replace advice given to you by your health care provider. Make sure you discuss any questions you have with your health care provider. Document Released: 09/06/2011 Document Revised: 11/26/2015 Document Reviewed: 11/26/2015 Elsevier Interactive Patient Education  [address] [place] Class  August 31, 2016  Wednesday 7:00p - 9:00p  [facility] [place], [place]  October 05, 2016  Wednesday 7:00p - 9:00p [facility] [place], [place]    November 09, 2016   Wednesday 7:00p - 9:000p [facility] [place], [place]  December 07, 2016  Wednesday  7:00p - 9:00p [facility] [place], [place]  January 04, 2017 Wednesday 7:00p - 9:00p [facility] [place], [place]  Interested in a waterbirth?  This informational class will help you discover whether waterbirth is the right fit for you.  Education about waterbirth itself, supplies you would need and how to assemble your support team is  what you can expect from this class.  Some obstetrical practices require this class in order to pursue a waterbirth.  (Not all obstetrical practices offer waterbirth check with your healthcare provider)  Register only the expectant mom, but you are encouraged to bring your partner to class!  Fees & Payment No fee  Register Online [email or web address] Search [facility] 2018 Prenatal Education Class Schedule Register at [email or web address] in the Classes & Resources Link or call Mardi Mainland at [phone] 9:00a-5:00p M-F  Childbirth Preparation Certified Childbirth Educators teach this 5 week course.  Expectant parents are encouraged to take this class in their 3rd trimester, completing it by their 35-36th week. Meets in [facility], Lower Level.  Mondays Thursdays  7:00-9:00 p 7:00-9:00 p  July 23 - August 20 July 19 - August 16  September 17 - October 15 September 6 -October 4  November 5 - December 3 October 25 - November 29   No Class on Thanksgiving Day -November 22  Childbirth Preparation Refresher Course For those who have previously attended Prepared Childbirth Preparation classes, this class in incorporated into the 3rd and 4th classes in the Monday night childbirth series.  Course meets in the [facility]. Lower Level from 7:00p - 9:00p  August 6 & 13  October 1 & 8  November 19 & 26   Weekend Childbirth Aundria Mems Classes are held Saturday & Sunday, 1:00 5:00p Course meets in [facility] [place] Level  August 4 & 5  November 3 & 4    The [address] Free tours are held on the third Sunday of each month at 3 pm.  The tour meets in the third floor waiting area and will take approximately 30 minutes.  Tours are also included in Childbirth class series as well as Brother/Sister class.  An online virtual tour can be seen at [email or web address]         Breastfeeding & Infant  Nutrition The course incorporates returning to work or school.  Breast milk collection and storage with basic breastfeeding and infant nutrition. This two-class course is held the  2nd and 3rd Tuesday of each month from 7:00 -9:00 pm.  Course meets in the [facility] Lower level  June 12 & 19 July-No Class  August 14 & 21 September 11 &18  September 11 & 18 October 9 &16  November 13 & 20 December 11 & 18   [employer] welcomes any mother for a social outing with other Moms to share experiences and challenges in an informal setting.  Meets the 1st Thursday and 3rd Thursday 11:30a-1:00 pm of each month in [facility] 3rd floor classroom.  No registration required.  Newborn Essentials This course covers bathing, diapering, swaddling and more with practice on lifelike dolls.  Participants will also learn safety tips and infant CPR (Not for certification).  It is held the 1st Wednesday of each month from 7:00p-9:00p in the [facility], Lower level.  June 6 July- No Class  August 1 September 5  October 3 November 7  December 7    Preparing Big Brother & Sister This one session course prepares children and their parents for the arrival of a new baby.  It is held on the 1st Tuesday of each month from 6:30p - 8:00p. Course meets in the [facility], Lower level.  July-No Class August 7  September 4 October 2  November 6 December 4   [place] [facility] [employer] This nationally acclaimed class helps expecting and new dads with the basic skills and confidence to bond with their infants, support their mates, and provide a safe and healthy home environment for their new family. Classes are held the 2nd Saturday of every month from 9:00a - 12:00 noon.  Course meets in the [facility] Lower level.  June 9 August 11  October 13 No Class in December  Third Trimester of Pregnancy The third trimester is from week 28 through week 40 (months 7 through 9). The third  trimester is a time when the unborn baby (fetus) is growing rapidly. At the end of the ninth month, the fetus is about 20 inches in length and weighs 6-10 pounds. Body changes during your third trimester Your body will continue to go through many changes during pregnancy. The changes vary from woman to woman. During the third trimester:  Your weight will continue to increase. You can expect to gain 25-35 pounds (11-16 kg) by the end of the pregnancy.  You may begin to get stretch marks on your hips, abdomen, and breasts.  You may urinate more often because the fetus is moving lower into your pelvis and pressing on your bladder.  You may develop or continue to have heartburn. This is caused by increased hormones that slow down muscles in the digestive tract.  You may develop or continue to have constipation because increased hormones slow digestion and cause the muscles that push waste through your intestines to relax.  You may develop hemorrhoids. These are swollen veins (varicose veins) in the rectum that can itch or be painful.  You may develop swollen, bulging veins (varicose veins) in your legs.  You may have increased body aches in the pelvis, back, or thighs. This is due to weight gain and increased hormones that are relaxing your joints.  You may have changes in your hair. These can include thickening of your hair, rapid growth, and changes in texture. Some women also have hair loss during or after pregnancy, or hair that feels dry or thin. Your hair will most likely return to normal  after your baby is born.  Your breasts will continue to grow and they will continue to become tender. A yellow fluid (colostrum) may leak from your breasts. This is the first milk you are producing for your baby.  Your belly button may stick out.  You may notice more swelling in your hands, face, or ankles.  You may have increased tingling or numbness in your hands, arms, and legs. The skin on your  belly may also feel numb.  You may feel short of breath because of your expanding uterus.  You may have more problems sleeping. This can be caused by the size of your belly, increased need to urinate, and an increase in your body's metabolism.  You may notice the fetus "dropping," or moving lower in your abdomen (lightening).  You may have increased vaginal discharge.  You may notice your joints feel loose and you may have pain around your pelvic bone.  What to expect at prenatal visits You will have prenatal exams every 2 weeks until week 36. Then you will have weekly prenatal exams. During a routine prenatal visit:  You will be weighed to make sure you and the baby are growing normally.  Your blood pressure will be taken.  Your abdomen will be measured to track your baby's growth.  The fetal heartbeat will be listened to.  Any test results from the previous visit will be discussed.  You may have a cervical check near your due date to see if your cervix has softened or thinned (effaced).  You will be tested for Group B streptococcus. This happens between 35 and 37 weeks.  Your health care provider may ask you:  What your birth plan is.  How you are feeling.  If you are feeling the baby move.  If you have had any abnormal symptoms, such as leaking fluid, bleeding, severe headaches, or abdominal cramping.  If you are using any tobacco products, including cigarettes, chewing tobacco, and electronic cigarettes.  If you have any questions.  Other tests or screenings that may be performed during your third trimester include:  Blood tests that check for low iron levels (anemia).  Fetal testing to check the health, activity level, and growth of the fetus. Testing is done if you have certain medical conditions or if there are problems during the pregnancy.  Nonstress test (NST). This test checks the health of your baby to make sure there are no signs of problems, such as the  baby not getting enough oxygen. During this test, a belt is placed around your belly. The baby is made to move, and its heart rate is monitored during movement.  What is false labor? False labor is a condition in which you feel small, irregular tightenings of the muscles in the womb (contractions) that usually go away with rest, changing position, or drinking water. These are called Braxton Hicks contractions. Contractions may last for hours, days, or even weeks before true labor sets in. If contractions come at regular intervals, become more frequent, increase in intensity, or become painful, you should see your health care provider. What are the signs of labor?  Abdominal cramps.  Regular contractions that start at 10 minutes apart and become stronger and more frequent with time.  Contractions that start on the top of the uterus and spread down to the lower abdomen and back.  Increased pelvic pressure and dull back pain.  A watery or bloody mucus discharge that comes from the vagina.  Leaking of amniotic  fluid. This is also known as your "water breaking." It could be a slow trickle or a gush. Let your health care provider know if it has a color or strange odor. If you have any of these signs, call your health care provider right away, even if it is before your due date. Follow these instructions at home: Medicines  Follow your health care provider's instructions regarding medicine use. Specific medicines may be either safe or unsafe to take during pregnancy.  Take a prenatal vitamin that contains at least 600 micrograms (mcg) of folic acid.  If you develop constipation, try taking a stool softener if your health care provider approves. Eating and drinking  Eat a balanced diet that includes fresh fruits and vegetables, whole grains, good sources of protein such as meat, eggs, or tofu, and low-fat dairy. Your health care provider will help you determine the amount of weight gain that is  right for you.  Avoid raw meat and uncooked cheese. These carry germs that can cause birth defects in the baby.  If you have low calcium intake from food, talk to your health care provider about whether you should take a daily calcium supplement.  Eat four or five small meals rather than three large meals a day.  Limit foods that are high in fat and processed sugars, such as fried and sweet foods.  To prevent constipation: ? Drink enough fluid to keep your urine clear or pale yellow. ? Eat foods that are high in fiber, such as fresh fruits and vegetables, whole grains, and beans. Activity  Exercise only as directed by your health care provider. Most women can continue their usual exercise routine during pregnancy. Try to exercise for 30 minutes at least 5 days a week. Stop exercising if you experience uterine contractions.  Avoid heavy lifting.  Do not exercise in extreme heat or humidity, or at high altitudes.  Wear low-heel, comfortable shoes.  Practice good posture.  You may continue to have sex unless your health care provider tells you otherwise. Relieving pain and discomfort  Take frequent breaks and rest with your legs elevated if you have leg cramps or low back pain.  Take warm sitz baths to soothe any pain or discomfort caused by hemorrhoids. Use hemorrhoid cream if your health care provider approves.  Wear a good support bra to prevent discomfort from breast tenderness.  If you develop varicose veins: ? Wear support pantyhose or compression stockings as told by your healthcare provider. ? Elevate your feet for 15 minutes, 3-4 times a day. Prenatal care  Write down your questions. Take them to your prenatal visits.  Keep all your prenatal visits as told by your health care provider. This is important. Safety  Wear your seat belt at all times when driving.  Make a list of emergency phone numbers, including numbers for family, friends, the hospital, and police and  fire departments. General instructions  Avoid cat litter boxes and soil used by cats. These carry germs that can cause birth defects in the baby. If you have a cat, ask someone to clean the litter box for you.  Do not travel far distances unless it is absolutely necessary and only with the approval of your health care provider.  Do not use hot tubs, steam rooms, or saunas.  Do not drink alcohol.  Do not use any products that contain nicotine or tobacco, such as cigarettes and e-cigarettes. If you need help quitting, ask your health care provider.  Do not  use any medicinal herbs or unprescribed drugs. These chemicals affect the formation and growth of the baby.  Do not douche or use tampons or scented sanitary pads.  Do not cross your legs for long periods of time.  To prepare for the arrival of your baby: ? Take prenatal classes to understand, practice, and ask questions about labor and delivery. ? Make a trial run to the hospital. ? Visit the hospital and tour the maternity area. ? Arrange for maternity or paternity leave through employers. ? Arrange for family and friends to take care of pets while you are in the hospital. ? Purchase a rear-facing car seat and make sure you know how to install it in your car. ? Pack your hospital bag. ? Prepare the baby's nursery. Make sure to remove all pillows and stuffed animals from the baby's crib to prevent suffocation.  Visit your dentist if you have not gone during your pregnancy. Use a soft toothbrush to brush your teeth and be gentle when you floss. Contact a health care provider if:  You are unsure if you are in labor or if your water has broken.  You become dizzy.  You have mild pelvic cramps, pelvic pressure, or nagging pain in your abdominal area.  You have lower back pain.  You have persistent nausea, vomiting, or diarrhea.  You have an unusual or bad smelling vaginal discharge.  You have pain when you urinate. Get help  right away if:  Your water breaks before 37 weeks.  You have regular contractions less than 5 minutes apart before 37 weeks.  You have a fever.  You are leaking fluid from your vagina.  You have spotting or bleeding from your vagina.  You have severe abdominal pain or cramping.  You have rapid weight loss or weight gain.  You have shortness of breath with chest pain.  You notice sudden or extreme swelling of your face, hands, ankles, feet, or legs.  Your baby makes fewer than 10 movements in 2 hours.  You have severe headaches that do not go away when you take medicine.  You have vision changes. Summary  The third trimester is from week 28 through week 40, months 7 through 9. The third trimester is a time when the unborn baby (fetus) is growing rapidly.  During the third trimester, your discomfort may increase as you and your baby continue to gain weight. You may have abdominal, leg, and back pain, sleeping problems, and an increased need to urinate.  During the third trimester your breasts will keep growing and they will continue to become tender. A yellow fluid (colostrum) may leak from your breasts. This is the first milk you are producing for your baby.  False labor is a condition in which you feel small, irregular tightenings of the muscles in the womb (contractions) that eventually go away. These are called Braxton Hicks contractions. Contractions may last for hours, days, or even weeks before true labor sets in.  Signs of labor can include: abdominal cramps; regular contractions that start at 10 minutes apart and become stronger and more frequent with time; watery or bloody mucus discharge that comes from the vagina; increased pelvic pressure and dull back pain; and leaking of amniotic fluid. This information is not intended to replace advice given to you by your health care provider. Make sure you discuss any questions you have with your health care provider. Document  Released: 03/01/2001 Document Revised: 08/13/2015 Document Reviewed: 05/08/2012 Elsevier Interactive Patient Education  2017 Elsevier Inc.  

## 2016-11-29 NOTE — Progress Notes (Signed)
ROB-Pt doing well, reports increased reflux at night while sleeping. Discussed home treatment measures. Rx Zantac, see orders. Glucola and TDaP today. Blood transfusion consent reviewed and signed. Plans breastfeeding. Declines classes, plans to "wing it". Aunt and husband will be labor support. Anticipatory guidance regarding course of prenatal care. Reviewed red flag symptoms and when to call. RTC x 2 weeks for ROB or sooner if needed.

## 2016-11-30 LAB — CBC WITH DIFFERENTIAL/PLATELET
BASOS ABS: 0 10*3/uL (ref 0.0–0.2)
Basos: 0 %
EOS (ABSOLUTE): 0.1 10*3/uL (ref 0.0–0.4)
Eos: 1 %
HEMOGLOBIN: 11.5 g/dL (ref 11.1–15.9)
Hematocrit: 35.1 % (ref 34.0–46.6)
IMMATURE GRANS (ABS): 0.2 10*3/uL — AB (ref 0.0–0.1)
IMMATURE GRANULOCYTES: 2 %
LYMPHS: 20 %
Lymphocytes Absolute: 1.6 10*3/uL (ref 0.7–3.1)
MCH: 26.4 pg — AB (ref 26.6–33.0)
MCHC: 32.8 g/dL (ref 31.5–35.7)
MCV: 81 fL (ref 79–97)
MONOCYTES: 5 %
Monocytes Absolute: 0.4 10*3/uL (ref 0.1–0.9)
NEUTROS ABS: 5.4 10*3/uL (ref 1.4–7.0)
NEUTROS PCT: 72 %
PLATELETS: 176 10*3/uL (ref 150–379)
RBC: 4.36 x10E6/uL (ref 3.77–5.28)
RDW: 13.7 % (ref 12.3–15.4)
WBC: 7.7 10*3/uL (ref 3.4–10.8)

## 2016-11-30 LAB — GLUCOSE, 1 HOUR GESTATIONAL: GESTATIONAL DIABETES SCREEN: 74 mg/dL (ref 65–139)

## 2016-12-13 ENCOUNTER — Ambulatory Visit (INDEPENDENT_AMBULATORY_CARE_PROVIDER_SITE_OTHER): Payer: No Typology Code available for payment source | Admitting: Certified Nurse Midwife

## 2016-12-13 ENCOUNTER — Encounter: Payer: Self-pay | Admitting: Certified Nurse Midwife

## 2016-12-13 VITALS — BP 101/79 | HR 80 | Wt 153.2 lb

## 2016-12-13 DIAGNOSIS — Z3483 Encounter for supervision of other normal pregnancy, third trimester: Secondary | ICD-10-CM

## 2016-12-13 NOTE — Patient Instructions (Signed)

## 2016-12-13 NOTE — Progress Notes (Signed)
ROB doing well. She states that she is taking stool softer for constipation that is helping. Encouraged PO hydration and fiber in diet to help. She agrees to plan. She endorses good fetal movement.  Follow up 2 wks.   Doreene Burke, CNM

## 2016-12-28 ENCOUNTER — Ambulatory Visit (INDEPENDENT_AMBULATORY_CARE_PROVIDER_SITE_OTHER): Payer: No Typology Code available for payment source | Admitting: Obstetrics and Gynecology

## 2016-12-28 VITALS — BP 103/78 | HR 90 | Wt 159.0 lb

## 2016-12-28 DIAGNOSIS — Z3493 Encounter for supervision of normal pregnancy, unspecified, third trimester: Secondary | ICD-10-CM

## 2016-12-28 NOTE — Progress Notes (Signed)
ROB- pt is doing well, will get flu vaccine @ work

## 2016-12-28 NOTE — Progress Notes (Signed)
ROB-ENCOURAGED CLASSES, working 10 hours days, plans breast feeding, unsure on BC, can't tolerate pills.

## 2017-01-09 ENCOUNTER — Ambulatory Visit (INDEPENDENT_AMBULATORY_CARE_PROVIDER_SITE_OTHER): Payer: No Typology Code available for payment source | Admitting: Certified Nurse Midwife

## 2017-01-09 ENCOUNTER — Encounter: Payer: Self-pay | Admitting: Certified Nurse Midwife

## 2017-01-09 VITALS — BP 110/71 | HR 79 | Wt 162.7 lb

## 2017-01-09 DIAGNOSIS — Z3493 Encounter for supervision of normal pregnancy, unspecified, third trimester: Secondary | ICD-10-CM

## 2017-01-09 DIAGNOSIS — R319 Hematuria, unspecified: Secondary | ICD-10-CM

## 2017-01-09 LAB — POCT URINALYSIS DIPSTICK
BILIRUBIN UA: NEGATIVE
GLUCOSE UA: NEGATIVE
Ketones, UA: NEGATIVE
LEUKOCYTES UA: NEGATIVE
Nitrite, UA: NEGATIVE
SPEC GRAV UA: 1.01 (ref 1.010–1.025)
Urobilinogen, UA: 0.2 E.U./dL
pH, UA: 8 (ref 5.0–8.0)

## 2017-01-09 NOTE — Patient Instructions (Signed)
Round Ligament Pain °The round ligament is a cord of muscle and tissue that helps to support the uterus. It can become a source of pain during pregnancy if it becomes stretched or twisted as the baby grows. The pain usually begins in the second trimester of pregnancy, and it can come and go until the baby is delivered. It is not a serious problem, and it does not cause harm to the baby. °Round ligament pain is usually a short, sharp, and pinching pain, but it can also be a dull, lingering, and aching pain. The pain is felt in the lower side of the abdomen or in the groin. It usually starts deep in the groin and moves up to the outside of the hip area. Pain can occur with: °· A sudden change in position. °· Rolling over in bed. °· Coughing or sneezing. °· Physical activity. ° °Follow these instructions at home: °Watch your condition for any changes. Take these steps to help with your pain: °· When the pain starts, relax. Then try: °? Sitting down. °? Flexing your knees up to your abdomen. °? Lying on your side with one pillow under your abdomen and another pillow between your legs. °? Sitting in a warm bath for 15-20 minutes or until the pain goes away. °· Take over-the-counter and prescription medicines only as told by your health care provider. °· Move slowly when you sit and stand. °· Avoid long walks if they cause pain. °· Stop or lessen your physical activities if they cause pain. ° °Contact a health care provider if: °· Your pain does not go away with treatment. °· You feel pain in your back that you did not have before. °· Your medicine is not helping. °Get help right away if: °· You develop a fever or chills. °· You develop uterine contractions. °· You develop vaginal bleeding. °· You develop nausea or vomiting. °· You develop diarrhea. °· You have pain when you urinate. °This information is not intended to replace advice given to you by your health care provider. Make sure you discuss any questions you have  with your health care provider. °Document Released: 12/15/2007 Document Revised: 08/13/2015 Document Reviewed: 05/14/2014 °Elsevier Interactive Patient Education © 2018 Elsevier Inc[employer] °Braxton Hicks Contractions °Contractions of the uterus can occur throughout pregnancy, but they are not always a sign that you are in labor. You may have practice contractions called Braxton Hicks contractions. These false labor contractions are sometimes confused with true labor. °What are Braxton Hicks contractions? °Braxton Hicks contractions are tightening movements that occur in the muscles of the uterus before labor. Unlike true labor contractions, these contractions do not result in opening (dilation) and thinning of the cervix. Toward the end of pregnancy (32-34 weeks), Braxton Hicks contractions can happen more often and may become stronger. These contractions are sometimes difficult to tell apart from true labor because they can be very uncomfortable. You should not feel embarrassed if you go to the hospital with false labor. °Sometimes, the only way to tell if you are in true labor is for your health care provider to look for changes in the cervix. The health care provider will do a physical exam and may monitor your contractions. If you are not in true labor, the exam should show that your cervix is not dilating and your water has not broken. °If there are no prenatal problems or other health problems associated with your pregnancy, it is completely safe for you to be sent home with false   labor. You may continue to have Braxton Hicks contractions until you go into true labor. °How can I tell the difference between true labor and false labor? °· Differences °? False labor °? Contractions last 30-70 seconds.: Contractions are usually shorter and not as strong as true labor contractions. °? Contractions become very regular.: Contractions are usually irregular. °? Discomfort is usually felt in the top of the uterus, and it  spreads to the lower abdomen and low back.: Contractions are often felt in the front of the lower abdomen and in the groin. °? Contractions do not go away with walking.: Contractions may go away when you walk around or change positions while lying down. °? Contractions usually become more intense and increase in frequency.: Contractions get weaker and are shorter-lasting as time goes on. °? The cervix dilates and gets thinner.: The cervix usually does not dilate or become thin. °Follow these instructions at home: °· Take over-the-counter and prescription medicines only as told by your health care provider. °· Keep up with your usual exercises and follow other instructions from your health care provider. °· Eat and drink lightly if you think you are going into labor. °· If Braxton Hicks contractions are making you uncomfortable: °? Change your position from lying down or resting to walking, or change from walking to resting. °? Sit and rest in a tub of warm water. °? Drink enough fluid to keep your urine clear or pale yellow. Dehydration may cause these contractions. °? Do slow and deep breathing several times an hour. °· Keep all follow-up prenatal visits as told by your health care provider. This is important. °Contact a health care provider if: °· You have a fever. °· You have continuous pain in your abdomen. °Get help right away if: °· Your contractions become stronger, more regular, and closer together. °· You have fluid leaking or gushing from your vagina. °· You pass blood-tinged mucus (bloody show). °· You have bleeding from your vagina. °· You have low back pain that you never had before. °· You feel your baby’s head pushing down and causing pelvic pressure. °· Your baby is not moving inside you as much as it used to. °Summary °· Contractions that occur before labor are called Braxton Hicks contractions, false labor, or practice contractions. °· Braxton Hicks contractions are usually shorter, weaker, farther  apart, and less regular than true labor contractions. True labor contractions usually become progressively stronger and regular and they become more frequent. °· Manage discomfort from Braxton Hicks contractions by changing position, resting in a warm bath, drinking plenty of water, or practicing deep breathing. °This information is not intended to replace advice given to you by your health care provider. Make sure you discuss any questions you have with your health care provider. °Document Released: 03/07/2005 Document Revised: 01/25/2016 Document Reviewed: 01/25/2016 °Elsevier Interactive Patient Education © 2017 Elsevier Inc. ° °

## 2017-01-09 NOTE — Addendum Note (Signed)
Addended by: Marchelle FolksMILLER, Tuyen Uncapher G on: 01/09/2017 11:07 AM   Modules accepted: Orders

## 2017-01-09 NOTE — Progress Notes (Signed)
Rob-Pt doing well, reports groin pain that resolved with Tylenol. Discussed home treatment measures including use of abdominal support. Reviewed red flag symptoms and when to call. Anticipatory guidance regarding 36 week cultures and course of prenatal care. Reviewed red flag symptoms and when to call. RTC x 2 weeks for 36 week cultures and ROB with Pattricia BossAnnie or sooner if needed.

## 2017-01-09 NOTE — Progress Notes (Signed)
ROb- flu vaccine given at work. Groin pain- taking 1 gram tylenol daily.

## 2017-01-11 LAB — URINE CULTURE

## 2017-01-16 ENCOUNTER — Encounter: Payer: Self-pay | Admitting: Certified Nurse Midwife

## 2017-01-17 ENCOUNTER — Inpatient Hospital Stay
Admission: EM | Admit: 2017-01-17 | Discharge: 2017-01-20 | DRG: 805 | Disposition: A | Discharge status: Home / Self Care | Payer: No Typology Code available for payment source | Attending: Certified Nurse Midwife | Admitting: Certified Nurse Midwife

## 2017-01-17 ENCOUNTER — Ambulatory Visit (INDEPENDENT_AMBULATORY_CARE_PROVIDER_SITE_OTHER): Payer: No Typology Code available for payment source | Admitting: Certified Nurse Midwife

## 2017-01-17 ENCOUNTER — Inpatient Hospital Stay: Payer: No Typology Code available for payment source | Admitting: Anesthesiology

## 2017-01-17 VITALS — BP 116/91 | HR 75 | Wt 162.0 lb

## 2017-01-17 DIAGNOSIS — O99344 Other mental disorders complicating childbirth: Secondary | ICD-10-CM | POA: Diagnosis present

## 2017-01-17 DIAGNOSIS — O42913 Preterm premature rupture of membranes, unspecified as to length of time between rupture and onset of labor, third trimester: Secondary | ICD-10-CM | POA: Diagnosis present

## 2017-01-17 DIAGNOSIS — Z3A35 35 weeks gestation of pregnancy: Secondary | ICD-10-CM

## 2017-01-17 DIAGNOSIS — Z3493 Encounter for supervision of normal pregnancy, unspecified, third trimester: Secondary | ICD-10-CM

## 2017-01-17 DIAGNOSIS — F429 Obsessive-compulsive disorder, unspecified: Secondary | ICD-10-CM | POA: Diagnosis present

## 2017-01-17 DIAGNOSIS — O42919 Preterm premature rupture of membranes, unspecified as to length of time between rupture and onset of labor, unspecified trimester: Secondary | ICD-10-CM

## 2017-01-17 LAB — CBC
HCT: 34.1 % — ABNORMAL LOW (ref 35.0–47.0)
Hemoglobin: 11.5 g/dL — ABNORMAL LOW (ref 12.0–16.0)
MCH: 25.9 pg — ABNORMAL LOW (ref 26.0–34.0)
MCHC: 33.6 g/dL (ref 32.0–36.0)
MCV: 77.1 fL — ABNORMAL LOW (ref 80.0–100.0)
PLATELETS: 196 10*3/uL (ref 150–440)
RBC: 4.43 MIL/uL (ref 3.80–5.20)
RDW: 13.9 % (ref 11.5–14.5)
WBC: 9.7 10*3/uL (ref 3.6–11.0)

## 2017-01-17 LAB — POCT URINALYSIS DIPSTICK
BILIRUBIN UA: NEGATIVE
GLUCOSE UA: NEGATIVE
Ketones, UA: NEGATIVE
NITRITE UA: NEGATIVE
Protein, UA: NEGATIVE
Spec Grav, UA: 1.015 (ref 1.010–1.025)
Urobilinogen, UA: 0.2 E.U./dL
pH, UA: 6.5 (ref 5.0–8.0)

## 2017-01-17 LAB — CHLAMYDIA/NGC RT PCR (ARMC ONLY)
CHLAMYDIA TR: NOT DETECTED
N gonorrhoeae: NOT DETECTED

## 2017-01-17 LAB — TYPE AND SCREEN
ABO/RH(D): B POS
ANTIBODY SCREEN: NEGATIVE

## 2017-01-17 MED ORDER — MISOPROSTOL 200 MCG PO TABS
ORAL_TABLET | ORAL | Status: AC
  Filled 2017-01-17: qty 4

## 2017-01-17 MED ORDER — LACTATED RINGERS IV SOLN
500.0000 mL | Freq: Once | INTRAVENOUS | Status: AC
  Administered 2017-01-18: 500 mL via INTRAVENOUS

## 2017-01-17 MED ORDER — SODIUM CHLORIDE 0.9 % IV SOLN
2.0000 g | Freq: Four times a day (QID) | INTRAVENOUS | Status: DC
  Administered 2017-01-17 – 2017-01-18 (×5): 2 g via INTRAVENOUS
  Filled 2017-01-17 (×8): qty 2000

## 2017-01-17 MED ORDER — LIDOCAINE HCL (PF) 1 % IJ SOLN
INTRAMUSCULAR | Status: AC
  Filled 2017-01-17: qty 30

## 2017-01-17 MED ORDER — ACETAMINOPHEN 325 MG PO TABS
650.0000 mg | ORAL_TABLET | ORAL | Status: DC | PRN
  Administered 2017-01-17: 650 mg via ORAL
  Filled 2017-01-17: qty 2

## 2017-01-17 MED ORDER — FENTANYL 2.5 MCG/ML W/ROPIVACAINE 0.15% IN NS 100 ML EPIDURAL (ARMC)
12.0000 mL/h | EPIDURAL | Status: DC
  Administered 2017-01-17 – 2017-01-18 (×3): 12 mL/h via EPIDURAL
  Filled 2017-01-17 (×2): qty 100

## 2017-01-17 MED ORDER — LACTATED RINGERS IV SOLN
INTRAVENOUS | Status: DC
  Administered 2017-01-17: 1000 mL via INTRAVENOUS
  Administered 2017-01-17: 23:00:00 via INTRAVENOUS
  Administered 2017-01-18: 1000 mL via INTRAVENOUS

## 2017-01-17 MED ORDER — TERBUTALINE SULFATE 1 MG/ML IJ SOLN: 0.2500 mg | Freq: Once | INTRAMUSCULAR | Status: DC | PRN

## 2017-01-17 MED ORDER — PHENYLEPHRINE 40 MCG/ML (10ML) SYRINGE FOR IV PUSH (FOR BLOOD PRESSURE SUPPORT)
80.0000 ug | PREFILLED_SYRINGE | INTRAVENOUS | Status: DC | PRN
  Filled 2017-01-17: qty 5

## 2017-01-17 MED ORDER — OXYTOCIN 10 UNIT/ML IJ SOLN: 10.0000 [IU] | Freq: Once | INTRAMUSCULAR | Status: DC

## 2017-01-17 MED ORDER — EPHEDRINE 5 MG/ML INJ
10.0000 mg | INTRAVENOUS | Status: DC | PRN
  Filled 2017-01-17: qty 2

## 2017-01-17 MED ORDER — OXYTOCIN 40 UNITS IN LACTATED RINGERS INFUSION - SIMPLE MED
2.5000 [IU]/h | INTRAVENOUS | Status: DC
  Administered 2017-01-18: 2.5 [IU]/h via INTRAVENOUS

## 2017-01-17 MED ORDER — LIDOCAINE HCL (PF) 1 % IJ SOLN: 30.0000 mL | INTRAMUSCULAR | Status: DC | PRN

## 2017-01-17 MED ORDER — OXYTOCIN 40 UNITS IN LACTATED RINGERS INFUSION - SIMPLE MED
1.0000 m[IU]/min | INTRAVENOUS | Status: DC
  Administered 2017-01-17 – 2017-01-18 (×2): 2 m[IU]/min via INTRAVENOUS
  Filled 2017-01-17: qty 1000

## 2017-01-17 MED ORDER — OXYTOCIN 10 UNIT/ML IJ SOLN
INTRAMUSCULAR | Status: AC
  Filled 2017-01-17: qty 2

## 2017-01-17 MED ORDER — SOD CITRATE-CITRIC ACID 500-334 MG/5ML PO SOLN: 30.0000 mL | ORAL | Status: DC | PRN

## 2017-01-17 MED ORDER — BUTORPHANOL TARTRATE 1 MG/ML IJ SOLN: 1.0000 mg | INTRAMUSCULAR | Status: DC | PRN

## 2017-01-17 MED ORDER — AMMONIA AROMATIC IN INHA
RESPIRATORY_TRACT | Status: AC
  Filled 2017-01-17: qty 10

## 2017-01-17 MED ORDER — OXYTOCIN BOLUS FROM INFUSION
500.0000 mL | Freq: Once | INTRAVENOUS | Status: AC
  Administered 2017-01-18: 500 mL via INTRAVENOUS

## 2017-01-17 MED ORDER — DIPHENHYDRAMINE HCL 50 MG/ML IJ SOLN
12.5000 mg | INTRAMUSCULAR | Status: AC | PRN
  Administered 2017-01-18 (×3): 12.5 mg via INTRAVENOUS
  Filled 2017-01-17 (×2): qty 1

## 2017-01-17 MED ORDER — BUPIVACAINE HCL (PF) 0.25 % IJ SOLN
INTRAMUSCULAR | Status: DC | PRN
  Administered 2017-01-17 (×3): 5 mL via EPIDURAL

## 2017-01-17 MED ORDER — LIDOCAINE-EPINEPHRINE (PF) 1.5 %-1:200000 IJ SOLN
INTRAMUSCULAR | Status: DC | PRN
  Administered 2017-01-17: 3 mL

## 2017-01-17 MED ORDER — ONDANSETRON HCL 4 MG/2ML IJ SOLN
4.0000 mg | Freq: Four times a day (QID) | INTRAMUSCULAR | Status: DC | PRN
  Administered 2017-01-17 – 2017-01-18 (×2): 4 mg via INTRAVENOUS
  Filled 2017-01-17 (×2): qty 2

## 2017-01-17 MED ORDER — LACTATED RINGERS IV SOLN
500.0000 mL | INTRAVENOUS | Status: DC | PRN
  Administered 2017-01-17: 250 mL via INTRAVENOUS
  Administered 2017-01-17: 500 mL via INTRAVENOUS

## 2017-01-17 MED ORDER — MISOPROSTOL 25 MCG QUARTER TABLET
50.0000 ug | ORAL_TABLET | ORAL | Status: DC
  Administered 2017-01-18 (×3): 50 ug via VAGINAL
  Filled 2017-01-17 (×3): qty 2

## 2017-01-17 MED ORDER — FENTANYL 2.5 MCG/ML W/ROPIVACAINE 0.15% IN NS 100 ML EPIDURAL (ARMC)
EPIDURAL | Status: AC
  Administered 2017-01-17: 250 ug via EPIDURAL
  Filled 2017-01-17: qty 100

## 2017-01-17 NOTE — Patient Instructions (Signed)
Augmentation of Labor Augmentation of labor is when steps are taken to stimulate and strengthen uterine contractions during labor. This may be done when the contractions have slowed down or stopped, delaying progress of labor and delivery of the baby. Before beginning augmentation of labor, the health care provider will evaluate the condition of the mother and baby, the size and position of the baby, and the size of the birth canal. What are the reasons for labor augmentation? Reasons for augmentation of labor include:  Slow labor (prolonged first and second stage of labor) that has been associated with increased maternal risks, such as chorioamnionitis, postpartum hemorrhage, operative vaginal delivery, or third-degree or fourth-degree perineal lacerations.  Decreased average length of labor.  What methods are used for labor augmentation? Various methods may be used for augmentation of labor, including:  Oxytocin medicine. This medicine stimulates contractions. It is given through an IV access tube inserted into a vein.  Breaking the fluid-filled sac that surrounds the fetus (amniotic sac).  Stripping the membranes. The health care provider separates amniotic sac tissue from the cervix, causing the release of a hormone called progesterone that can stimulate uterine contractions.  Nipple stimulation.  Stimulation of certain pressure points on the ankles.  Manual or mechanical dilation of the cervix.  What are the risks associated with labor augmentation?  Overstimulation of the uterine contractions (continuous, prolonged, very strong contractions), causing fetal distress.  Increased chance of infection for the mother and baby.  Uterine tearing (rupture).  Breaking off (abruption) of the placenta.  Increased chance of cesarean, forceps, or vacuum delivery. What are some reasons for not doing labor augmentation? Augmentation of labor should not be done if:  The baby is too big for  the birth canal. This can be confirmed by ultrasonography.  The umbilical cord drops in front of the baby's head or breech part (prolapsed cord).  The mother had a previous cesarean delivery with a vertical incision in the uterus (or the kind of incision used is not known). High dose oxytocin should not be used if the mother had a previous cesarean delivery of any kind.  The mother had previous surgery on or into the uterus.  The mother has herpes.  The mother has cervical cancer.  The baby is lying sideways.  The mother's pelvis is deformed.  The mother is pregnant with more than two babies.  This information is not intended to replace advice given to you by your health care provider. Make sure you discuss any questions you have with your health care provider. Document Released: 08/30/2006 Document Revised: 08/19/2015 Document Reviewed: 10/04/2012 Elsevier Interactive Patient Education  2017 Elsevier Inc.  

## 2017-01-17 NOTE — H&P (Signed)
History and Physical   HPI  Carrie Schwartz is a 31 y.o. G2P0010 at [redacted]w[redacted]d Estimated Date of Delivery: 02/17/17 who is being admitted for  PPROM on 01/16/17 @ 2130. Pt. States that she had leaking since last night with clear fluid. She presented to office this morning for evaluation by M. Lawhorn CNM. She completed a sterile speculum pooling present and nitrazine positive. SVE in office 2/60/-3. She was sent to L&D for admission.    OB History  Obstetric History   G2   P0   T0   P0   A1   L0    SAB1   TAB0   Ectopic0   Multiple0   Live Births0     # Outcome Date GA Lbr Len/2nd Weight Sex Delivery Anes PTL Lv  2 Current           1 SAB  [redacted]w[redacted]d    SAB         PROBLEM LIST  Pregnancy complications or risks: Patient Active Problem List   Diagnosis Date Noted  . Labor and delivery, indication for care 01/17/2017  . OCD (obsessive compulsive disorder) 08/05/2016  . Pregnancy 08/05/2016    Prenatal labs and studies: ABO, Rh: --/--/B POS (10/30 1039) Antibody: NEG (10/30 1039) Rubella: 5.01 (05/24 1056) RPR: Non Reactive (05/24 1056)  HBsAg: Negative (05/24 1056)  HIV:   Non reactive GBS:  unknown, culture 01/17/17    Past Medical History:  Diagnosis Date  . ADHD      No past surgical history on file.   Medications    Current Discharge Medication List    CONTINUE these medications which have NOT CHANGED   Details  amphetamine-dextroamphetamine (ADDERALL) 20 MG tablet TK 1 T PO TID PRN Refills: 0    Prenatal Vit-Fe Fumarate-FA (MULTIVITAMIN-PRENATAL) 27-0.8 MG TABS tablet Take 1 tablet by mouth daily at 12 noon.         Allergies  Patient has no known allergies.  Review of Systems  Constitutional: negative Eyes: negative Ears, nose, mouth, throat, and face: negative Respiratory: negative Cardiovascular: negative Gastrointestinal: negative Genitourinary:negative Integument/breast: negative Hematologic/lymphatic:  negative Musculoskeletal:negative Neurological: negative Behavioral/Psych: negative Endocrine: negative Allergic/Immunologic: negative  Physical Exam  BP 115/89 (BP Location: Left Arm)   Pulse 87   Temp 97.8 F (36.6 C) (Oral)   Resp 18   Ht 5\' 7"  (1.702 m)   Wt 162 lb (73.5 kg)   LMP 05/13/2016 (Exact Date)   BMI 25.37 kg/m   Lungs:  CTA B Cardio: RRR  Abd: Soft, gravid, NT Presentation: cephalic EXT: No C/C/ 1+ Edema DTRs: 2+ B CERVIX: 3 cm  :  70%:   -2:    mid position:    firm  See Prenatal records for more detailed PE.     FHR:  Baseline: 140 bpm, Variability: Good {> 6 bpm), Accelerations: Reactive and Decelerations: Absent  Toco: Uterine Contractions: Frequency: Every 2-4 minutes, Duration: 40-60 seconds and Intensity: mild   Test Results  Results for orders placed or performed during the hospital encounter of 01/17/17 (from the past 24 hour(s))  Type and screen [facility]     Status: None   Collection Time: 01/17/17 10:39 AM  Result Value Ref Range   ABO/RH(D) B POS    Antibody Screen NEG    Sample Expiration 01/20/2017   CBC     Status: Abnormal   Collection Time: 01/17/17 10:39 AM  Result Value Ref Range   WBC 9.7 3.6 -  11.0 K/uL   RBC 4.43 3.80 - 5.20 MIL/uL   Hemoglobin 11.5 (L) 12.0 - 16.0 g/dL   HCT 16.134.1 (L) 09.635.0 - 04.547.0 %   MCV 77.1 (L) 80.0 - 100.0 fL   MCH 25.9 (L) 26.0 - 34.0 pg   MCHC 33.6 32.0 - 36.0 g/dL   RDW 40.913.9 81.111.5 - 91.414.5 %   Platelets 196 150 - 440 K/uL   Other:   Assessment   G2P0010 at 7222w4d Estimated Date of Delivery: 02/17/17  The fetus is reassuring. GBS unknown  Patient Active Problem List   Diagnosis Date Noted  . Labor and delivery, indication for care 01/17/2017  . OCD (obsessive compulsive disorder) 08/05/2016  . Pregnancy 08/05/2016    Plan  1. Admit to L&D :   IV Pitocin augmentation and place IUPC 2. EFM:-- Category 1 3. Epidural if desired. Stadol for IV pain until epidural  requested. 4. Admission labs  5. Consult with Nursery Dr. Eric FormWimmer regarding management, Dr. Valentino Saxonherry notified.  6. Reviewed plan of care with patient and her significant other. Discussed risk vs benefits of expectant management or induction of labor at 35.4 wks. Offered neonatology consult. Pt declined. She verbalizes understanding and agrees to plan of induction.   [name],CNM 01/17/2017 1:27 PM

## 2017-01-17 NOTE — Progress Notes (Signed)
Subjective:   Carrie Schwartz is a 31 y.o. G2P0010 6266w4d being seen today for her obstetrical visit.  Patient reports leakage of fluid since 2130 and intermittent lower abdominal cramping.    Denies contractions or vaginal bleeding.  Reports good fetal movement.  The following portions of the patient's history were reviewed and updated as appropriate: allergies, current medications, past family history, past medical history, past social history, past surgical history and problem list.   Objective:  BP (!) 116/91   Pulse 75   Wt 162 lb (73.5 kg)   LMP 05/13/2016 (Exact Date)   BMI 25.37 kg/m   Fetal Movement: Movement: Present  Presentation: Presentation: Vertex    Abdomen:  soft, gravid, appropriate for gestational age,non-tender  Vaginal:  pooling present, Nitrazine positive  Cervix: 2cm,60%,-3, soft,middle    Results for orders placed or performed in visit on 01/17/17 (from the past 24 hour(s))  POCT urinalysis dipstick     Status: Abnormal   Collection Time: 01/17/17  8:45 AM  Result Value Ref Range   Color, UA yellow    Clarity, UA cloudy    Glucose, UA negative    Bilirubin, UA negative    Ketones, UA negative    Spec Grav, UA 1.015 1.010 - 1.025   Blood, UA moderate    pH, UA 6.5 5.0 - 8.0   Protein, UA negative    Urobilinogen, UA 0.2 0.2 or 1.0 E.U./dL   Nitrite, UA negative    Leukocytes, UA Small (1+) (A) Negative    Assessment:   Pregnancy:  G2P0010 at 7666w4d  1. Third trimester pregnancy  - POCT urinalysis dipstick  2. Preterm premature rupture of membranes, unspecified duration to onset of labor   Plan:   Advised pt positive fern, pooling, and Nitrazine. [facility].   Fetal movement precautions reviewed.  RTC x 6 weeks for PPV with Annie.   Reports called to Doreene BurkeAnnie Thompson, on call CNM. Positive fern, pooling, and nitrazine. GBS and GC/Ch collected and sent with pt. Advised pt to enter through ER. CNM Janee Mornhompson will  notified [facility].    Gunnar BullaJenkins Michelle Lawhorn, CNM

## 2017-01-17 NOTE — OB Triage Note (Signed)
Pt states her water broke last night around 10:30p, she went into OB office this am and they sent her over for further evaluation

## 2017-01-17 NOTE — Progress Notes (Signed)
LABOR NOTE   Carrie Schwartz 30 y.o.GP@ at 6168w4d Early latent labor.  SUBJECTIVE:  She is comfortable with epidural  OBJECTIVE:  BP 105/73   Pulse 73   Temp 97.6 F (36.4 C) (Oral)   Resp 18   Ht 5\' 7"  (1.702 m)   Wt 162 lb (73.5 kg)   LMP 05/13/2016 (Exact Date)   SpO2 99%   BMI 25.37 kg/m  No intake/output data recorded.  She has shown cervical change. CERVIX: 4.5 cm:  80%:   -2:   mid position:   firm SVE:   Dilation: 4.5 Effacement (%): 80 Station: -2 Exam by:: A. Janee Mornhompson, CNM CONTRACTIONS: regular, every 1-2 minutes FHR: Fetal heart tracing reviewed. Baseline: 140 bpm, Variability: Good {> 6 bpm), Accelerations: Reactive and Decelerations: early and variable Category II   Analgesia: Epidural  Labs: Lab Results  Component Value Date   WBC 9.7 01/17/2017   HGB 11.5 (L) 01/17/2017   HCT 34.1 (L) 01/17/2017   MCV 77.1 (L) 01/17/2017   PLT 196 01/17/2017    ASSESSMENT: 1) Labor curve reviewed.       Progress: Early latent labor.     Membranes: ruptured, clear fluid        Active Problems:   Labor and delivery, indication for care PPROM  PLAN: continue present management, IV Pitocin augmentation and IUPC- titrate pitocin to adequate MVU's  Carrie Schwartz, CNM 01/17/2017 7:08 PM

## 2017-01-17 NOTE — Progress Notes (Signed)
Pt walks in office, c/o leaking fluid, cramping (menstrual type). Had a gush of fluid 9:30pm last night, soaking pad, panties, pants and has kept leaking through the night. Pink tinge discharge noted on pad.

## 2017-01-17 NOTE — Anesthesia Procedure Notes (Signed)
Epidural Patient location during procedure: OB Start time: 01/17/2017 5:18 PM End time: 01/17/2017 5:38 PM  Staffing Anesthesiologist: Alver FisherPENWARDEN, Kandee Escalante Performed: anesthesiologist   Preanesthetic Checklist Completed: patient identified, site marked, surgical consent, pre-op evaluation, timeout performed, IV checked, risks and benefits discussed and monitors and equipment checked  Epidural Patient position: sitting Prep: ChloraPrep Patient monitoring: heart rate, continuous pulse ox and blood pressure Approach: midline Location: L3-L4 Injection technique: LOR saline  Needle:  Needle type: Tuohy  Needle gauge: 18 G Needle length: 9 cm and 9 Needle insertion depth: 6 cm Catheter type: closed end flexible Catheter size: 20 Guage Catheter at skin depth: 10 cm Test dose: negative (0.125% bupivacaine)  Assessment Events: blood not aspirated, injection not painful, no injection resistance, negative IV test and no paresthesia  Additional Notes   Patient tolerated the insertion well without complications.Reason for block:procedure for pain

## 2017-01-17 NOTE — Anesthesia Preprocedure Evaluation (Signed)
Anesthesia Evaluation  Patient identified by MRN, date of birth, ID band Patient awake    Reviewed: Allergy & Precautions, NPO status , Patient's Chart, lab work & pertinent test results  History of Anesthesia Complications Negative for: history of anesthetic complications  Airway Mallampati: II  TM Distance: >3 FB Neck ROM: Full    Dental   Pulmonary neg sleep apnea, neg COPD, former smoker,    breath sounds clear to auscultation- rhonchi (-) wheezing      Cardiovascular Exercise Tolerance: Good (-) hypertension(-) CAD and (-) Past MI  Rhythm:Regular Rate:Normal - Systolic murmurs and - Diastolic murmurs    Neuro/Psych PSYCHIATRIC DISORDERS Anxiety negative neurological ROS     GI/Hepatic negative GI ROS, Neg liver ROS,   Endo/Other  negative endocrine ROSneg diabetes  Renal/GU negative Renal ROS     Musculoskeletal negative musculoskeletal ROS (+)   Abdominal (+) - obese, Gravid abdomen  Peds  Hematology negative hematology ROS (+)   Anesthesia Other Findings Past Medical History: No date: ADHD   Reproductive/Obstetrics (+) Pregnancy                             Lab Results  Component Value Date   WBC 9.7 01/17/2017   HGB 11.5 (L) 01/17/2017   HCT 34.1 (L) 01/17/2017   MCV 77.1 (L) 01/17/2017   PLT 196 01/17/2017    Anesthesia Physical Anesthesia Plan  ASA: II  Anesthesia Plan: Epidural   Post-op Pain Management:    Induction:   PONV Risk Score and Plan: 2  Airway Management Planned:   Additional Equipment:   Intra-op Plan:   Post-operative Plan:   Informed Consent: I have reviewed the patients History and Physical, chart, labs and discussed the procedure including the risks, benefits and alternatives for the proposed anesthesia with the patient or authorized representative who has indicated his/her understanding and acceptance.     Plan Discussed with:  Anesthesiologist  Anesthesia Plan Comments: (Plan for epidural for labor, discussed epidural vs spinal vs GA if need for csection)        Anesthesia Quick Evaluation

## 2017-01-18 DIAGNOSIS — O42913 Preterm premature rupture of membranes, unspecified as to length of time between rupture and onset of labor, third trimester: Secondary | ICD-10-CM

## 2017-01-18 DIAGNOSIS — Z3A35 35 weeks gestation of pregnancy: Secondary | ICD-10-CM

## 2017-01-18 LAB — RPR: RPR Ser Ql: NONREACTIVE

## 2017-01-18 MED ORDER — METHYLERGONOVINE MALEATE 0.2 MG/ML IJ SOLN: 0.2000 mg | INTRAMUSCULAR | Status: DC | PRN

## 2017-01-18 MED ORDER — SIMETHICONE 80 MG PO CHEW: 80.0000 mg | CHEWABLE_TABLET | ORAL | Status: DC | PRN

## 2017-01-18 MED ORDER — ACETAMINOPHEN 325 MG PO TABS: 650.0000 mg | ORAL_TABLET | ORAL | Status: DC | PRN

## 2017-01-18 MED ORDER — TETANUS-DIPHTH-ACELL PERTUSSIS 5-2.5-18.5 LF-MCG/0.5 IM SUSP: 0.5000 mL | Freq: Once | INTRAMUSCULAR | Status: DC

## 2017-01-18 MED ORDER — BENZOCAINE-MENTHOL 20-0.5 % EX AERO
1.0000 "application " | INHALATION_SPRAY | CUTANEOUS | Status: DC | PRN
  Filled 2017-01-18: qty 56

## 2017-01-18 MED ORDER — OXYCODONE-ACETAMINOPHEN 5-325 MG PO TABS
1.0000 | ORAL_TABLET | ORAL | Status: DC | PRN
  Administered 2017-01-19: 1 via ORAL
  Filled 2017-01-18: qty 1

## 2017-01-18 MED ORDER — DOCUSATE SODIUM 100 MG PO CAPS
100.0000 mg | ORAL_CAPSULE | Freq: Two times a day (BID) | ORAL | Status: DC
  Administered 2017-01-19 – 2017-01-20 (×3): 100 mg via ORAL
  Filled 2017-01-18 (×3): qty 1

## 2017-01-18 MED ORDER — ONDANSETRON HCL 4 MG PO TABS: 4.0000 mg | ORAL_TABLET | ORAL | Status: DC | PRN

## 2017-01-18 MED ORDER — IBUPROFEN 600 MG PO TABS
ORAL_TABLET | ORAL | Status: AC
  Filled 2017-01-18: qty 1

## 2017-01-18 MED ORDER — FERROUS SULFATE 325 (65 FE) MG PO TABS
325.0000 mg | ORAL_TABLET | Freq: Every day | ORAL | Status: DC
  Administered 2017-01-19 – 2017-01-20 (×2): 325 mg via ORAL
  Filled 2017-01-18 (×2): qty 1

## 2017-01-18 MED ORDER — WITCH HAZEL-GLYCERIN EX PADS
1.0000 "application " | MEDICATED_PAD | CUTANEOUS | Status: DC | PRN
  Administered 2017-01-19: 1 via TOPICAL
  Filled 2017-01-18: qty 100

## 2017-01-18 MED ORDER — IBUPROFEN 600 MG PO TABS
600.0000 mg | ORAL_TABLET | Freq: Four times a day (QID) | ORAL | Status: DC
  Administered 2017-01-18 – 2017-01-20 (×7): 600 mg via ORAL
  Filled 2017-01-18 (×7): qty 1

## 2017-01-18 MED ORDER — PRENATAL MULTIVITAMIN CH
1.0000 | ORAL_TABLET | Freq: Every day | ORAL | Status: DC
  Administered 2017-01-19 – 2017-01-20 (×2): 1 via ORAL
  Filled 2017-01-18 (×3): qty 1

## 2017-01-18 MED ORDER — OXYCODONE-ACETAMINOPHEN 5-325 MG PO TABS
2.0000 | ORAL_TABLET | ORAL | Status: DC | PRN
  Administered 2017-01-19 – 2017-01-20 (×5): 2 via ORAL
  Filled 2017-01-18 (×5): qty 2

## 2017-01-18 MED ORDER — SENNOSIDES-DOCUSATE SODIUM 8.6-50 MG PO TABS
2.0000 | ORAL_TABLET | ORAL | Status: DC
  Administered 2017-01-19 – 2017-01-20 (×2): 2 via ORAL
  Filled 2017-01-18 (×2): qty 2

## 2017-01-18 MED ORDER — COCONUT OIL OIL: 1.0000 "application " | TOPICAL_OIL | Status: DC | PRN

## 2017-01-18 MED ORDER — DIBUCAINE 1 % RE OINT: 1.0000 "application " | TOPICAL_OINTMENT | RECTAL | Status: DC | PRN

## 2017-01-18 MED ORDER — METHYLERGONOVINE MALEATE 0.2 MG PO TABS
0.2000 mg | ORAL_TABLET | ORAL | Status: DC | PRN
  Filled 2017-01-18: qty 1

## 2017-01-18 MED ORDER — ONDANSETRON HCL 4 MG/2ML IJ SOLN: 4.0000 mg | INTRAMUSCULAR | Status: DC | PRN

## 2017-01-18 NOTE — Progress Notes (Signed)
LABOR NOTE   Carrie Schwartz 31 y.o.GP@ at 5475w5d Active phase labor.  SUBJECTIVE:  Pt becoming uncomfortable with pressure  OBJECTIVE:  BP (!) 89/57 (BP Location: Right Arm)   Pulse 72   Temp 98.9 F (37.2 C) (Oral)   Resp 16   Ht 5\' 7"  (1.702 m)   Wt 162 lb (73.5 kg)   LMP 05/13/2016 (Exact Date)   SpO2 100%   BMI 25.37 kg/m  Total I/O In: 3972 [I.V.:3972] Out: 650 [Urine:650]  She has shown cervical change. CERVIX: 8.5 cm:  90%:   -2:   mid position:   Firm, Artificial rupture of fore bag : bloody fluid  SVE:   Dilation: 8.5 Effacement (%): 90 Station: -2 Exam by:: A. Janee Mornhompson, CNM CONTRACTIONS: regular, every 1-3 minutes FHR: Fetal heart tracing reviewed. Baseline: 150 bpm, Variability: Good {> 6 bpm), Accelerations: Reactive and Decelerations: variable Category II   Analgesia: Epidural  Labs: Lab Results  Component Value Date   WBC 9.7 01/17/2017   HGB 11.5 (L) 01/17/2017   HCT 34.1 (L) 01/17/2017   MCV 77.1 (L) 01/17/2017   PLT 196 01/17/2017    ASSESSMENT: 1) Labor curve reviewed.       Progress: Active phase labor.     Membranes: ruptured, bloody fluid      2)  Afebrile  Active Problems:   Labor and delivery, indication for care PPROM  PLAN: continue present management, IV Pitocin augmentation and pt to left side with peanut ball   Doreene Burkennie Murna Backer, CNM 01/18/2017 6:15 PM

## 2017-01-18 NOTE — Progress Notes (Signed)
LABOR NOTE   Carrie Schwartz 31 y.o.GP@ at 6765w5d Active phase labor. Notified via L&D RN of elevated HR. Temperature 98.1.   SUBJECTIVE:  Pt states that she is not feeling well.  OBJECTIVE:  BP 103/69   Pulse 75   Temp 98.4 F (36.9 C) (Oral)   Resp 18   Ht 5\' 7"  (1.702 m)   Wt 162 lb (73.5 kg)   LMP 05/13/2016 (Exact Date)   SpO2 100%   BMI 25.37 kg/m  Total I/O In: 3472 [I.V.:3472] Out: 650 [Urine:650]  Cervical exam defered  SVE:   Dilation: 6.5 Effacement (%): 90 Station: -2 Exam by:: A. Janee Mornhompson, CNM CONTRACTIONS: regular, every 1-5 minutes FHR: Fetal heart tracing reviewed. Baseline: 170 bpm, Variability: Good {> 6 bpm), Accelerations: No accelerations at this time and Decelerations: variable decel x 2, resolved Category II   Analgesia: Epidural  Labs: Lab Results  Component Value Date   WBC 9.7 01/17/2017   HGB 11.5 (L) 01/17/2017   HCT 34.1 (L) 01/17/2017   MCV 77.1 (L) 01/17/2017   PLT 196 01/17/2017    ASSESSMENT: 1) Labor curve reviewed.       Progress: Active phase labor.     Membranes: ruptured, clear fluid      2)  Afebrile  3) BP stable   Active Problems:   Labor and delivery, indication for care PPROM  PLAN: IV fluid bolus Encouraged pt to take some PO with sugar, will continue to monitor   Doreene BurkeAnnie Kieara Schwark, CNM 01/18/2017 3:24 PM

## 2017-01-18 NOTE — Progress Notes (Signed)
LABOR NOTE   Carrie Schwartz 31 y.o.GP@ at 4127w5d Early latent labor.  SUBJECTIVE:  Comfortable with epidural  OBJECTIVE:  BP 106/74   Pulse 70   Temp 97.9 F (36.6 C) (Oral)   Resp 16   Ht 5\' 7"  (1.702 m)   Wt 162 lb (73.5 kg)   LMP 05/13/2016 (Exact Date)   SpO2 99%   BMI 25.37 kg/m  No intake/output data recorded.  She has not shown cervical change. CERVIX: 4.5 cm:  SVE:   Dilation: 4.5 Effacement (%): 80 Station: -2 Exam by:: P Funk RN CONTRACTIONS: regular, every 1-4 minutes FHR: Fetal heart tracing reviewed. Baseline: 140 bpm, Variability: Good {> 6 bpm), Accelerations: Reactive and Decelerations: Absent Category I   Analgesia: Epidural  Labs: Lab Results  Component Value Date   WBC 9.7 01/17/2017   HGB 11.5 (L) 01/17/2017   HCT 34.1 (L) 01/17/2017   MCV 77.1 (L) 01/17/2017   PLT 196 01/17/2017    ASSESSMENT: 1) Labor curve reviewed.       Progress: Early latent labor.     Membranes: ruptured, clear fluid      2)  Afebrile   Active Problems:   Labor and delivery, indication for care PPROM  PLAN: Pitocin stopped, patient to eat. Cytotec 50 mcg vaginally after 2hr pitocin break.   Doreene Burkennie Jeanann Balinski, CNM 01/18/2017 8:55 AM

## 2017-01-18 NOTE — Progress Notes (Signed)
LABOR NOTE   Carrie Schwartz 30 y.o.GP@ at 683w5d Early latent labor.  SUBJECTIVE:  Comfortable with epidural.  OBJECTIVE:  BP 106/74   Pulse 70   Temp 97.9 F (36.6 C) (Oral)   Resp 16   Ht 5\' 7"  (1.702 m)   Wt 162 lb (73.5 kg)   LMP 05/13/2016 (Exact Date)   SpO2 99%   BMI 25.37 kg/m  No intake/output data recorded.  Cervical exam deferred   SVE:   Dilation: 4.5 Effacement (%): 80 Station: -2 Exam by:: P Funk RN CONTRACTIONS: regular, every 1-484minutes FHR: Fetal heart tracing reviewed. Baseline: 135 bpm, Variability: Good {> 6 bpm), Accelerations: Reactive and Decelerations: Absent Category I   Analgesia: Epidural  Labs: Lab Results  Component Value Date   WBC 9.7 01/17/2017   HGB 11.5 (L) 01/17/2017   HCT 34.1 (L) 01/17/2017   MCV 77.1 (L) 01/17/2017   PLT 196 01/17/2017    ASSESSMENT: 1) Labor curve reviewed.       Progress: Early latent labor.     Membranes: ruptured, clear fluid      2)  Afebrile  Active Problems:   Labor and delivery, indication for care PPROM  PLAN: continue present management Dr. Valentino Saxonherry consulted on pt plan of care  Doreene Burkennie Donivan Thammavong, CNM 01/18/2017 9:00 AM

## 2017-01-18 NOTE — Progress Notes (Signed)
LABOR NOTE   Carrie Schwartz 30 y.o.GP@ at 7190w5d Active phase labor.  SUBJECTIVE:  Comfortable with epidural. Verbalizes understanding of care and agrees with plan .  OBJECTIVE:  BP 106/74   Pulse 70   Temp 98.2 F (36.8 C) (Oral)   Resp 18   Ht 5\' 7"  (1.702 m)   Wt 162 lb (73.5 kg)   LMP 05/13/2016 (Exact Date)   SpO2 98%   BMI 25.37 kg/m  Total I/O In: 3472 [I.V.:3472] Out: -   She has shown cervical change. CERVIX: 6.5 :  90%:   -2:   mid position:   firm SVE:   Dilation: 6.5 Effacement (%): 90 Station: -2 Exam by:: A. Janee Mornhompson, CNM CONTRACTIONS: regular, every 2-4 minutes FHR: Fetal heart tracing reviewed. Baseline: 150 bpm, Variability: Good {> 6 bpm), Accelerations: Reactive and Decelerations: Absent Category I   Analgesia: Epidural  Labs: Lab Results  Component Value Date   WBC 9.7 01/17/2017   HGB 11.5 (L) 01/17/2017   HCT 34.1 (L) 01/17/2017   MCV 77.1 (L) 01/17/2017   PLT 196 01/17/2017    ASSESSMENT: 1) Labor curve reviewed.       Progress: Active phase labor.     Membranes: ruptured, clear fluid      2)  afebrile  Active Problems:   Labor and delivery, indication for care PPROM  PLAN: Start pitocin per protocol after use of cytotec. Titrate as needed for ctx q 2-3 min apart with adequate MVU's Continue prophylactic antibiotics anticipate NSVD  Carrie Schwartz, CNM 01/18/2017 1:35 PM

## 2017-01-19 LAB — CBC
HEMATOCRIT: 25.8 % — AB (ref 35.0–47.0)
HEMOGLOBIN: 8.6 g/dL — AB (ref 12.0–16.0)
MCH: 26 pg (ref 26.0–34.0)
MCHC: 33.2 g/dL (ref 32.0–36.0)
MCV: 78.2 fL — ABNORMAL LOW (ref 80.0–100.0)
Platelets: 137 10*3/uL — ABNORMAL LOW (ref 150–440)
RBC: 3.3 MIL/uL — ABNORMAL LOW (ref 3.80–5.20)
RDW: 13.8 % (ref 11.5–14.5)
WBC: 10.3 10*3/uL (ref 3.6–11.0)

## 2017-01-19 LAB — CULTURE, BETA STREP (GROUP B ONLY)

## 2017-01-19 NOTE — Anesthesia Postprocedure Evaluation (Signed)
Anesthesia Post Note  Patient: Carrie Schwartz  Procedure(s) Performed: AN AD HOC LABOR EPIDURAL  Patient location during evaluation: Mother Baby Anesthesia Type: Epidural Level of consciousness: awake and alert Pain management: pain level controlled Vital Signs Assessment: post-procedure vital signs reviewed and stable Respiratory status: spontaneous breathing, nonlabored ventilation and respiratory function stable Cardiovascular status: stable Postop Assessment: no headache, no backache and epidural receding Anesthetic complications: no     Last Vitals:  Vitals:   01/19/17 0507 01/19/17 0812  BP: (!) 96/59 (!) 98/59  Pulse: 77 68  Resp: 18 20  Temp: 36.7 C 36.9 C  SpO2:  99%    Last Pain:  Vitals:   01/19/17 0812  TempSrc: Oral  PainSc:                  Marlana SalvageSandra Laporscha Linehan

## 2017-01-19 NOTE — Lactation Note (Signed)
Lactation Consultation Note  Patient Name: [name] ZOXWR'UToday's Date: 01/19/2017     Maternal Data:  Mother is a G1 P1 she plans to exclusively breast feed.  The baby is in SCN, I was able to start her pumping. She did not make any milk using the electric pump but was able to make milk by hand expression. Gilman SchmidtCarolyn P Levon Penning 01/19/2017, 3:04 PM

## 2017-01-19 NOTE — Progress Notes (Signed)
Progress Note - Vaginal Delivery  Carrie Schwartz is a 31 y.o. [id number] now PP day 1 s/p Vaginal, Spontaneous Delivery .   Subjective:  The patient reports no complaints, up ad lib, voiding, tolerating PO and + flatus  Objective:  Vital signs in last 24 hours: Temp:  [97.5 F (36.4 C)-98.9 F (37.2 C)] 98.5 F (36.9 C) (11/01 0812) Pulse Rate:  [68-88] 68 (11/01 0812) Resp:  [16-20] 20 (11/01 0812) BP: (89-110)/(57-77) 98/59 (11/01 0812) SpO2:  [97 %-100 %] 99 % (11/01 [id number])  Physical Exam:  General: alert, cooperative, appears stated age and fatigued  Lungs clear bilaterally Heart: RRR Bowel sounds present Lochia: appropriate Uterine Fundus: firm @ U DVT Evaluation: No evidence of DVT seen on physical exam. No cords or calf tenderness. No significant calf/ankle edema.    Data Review  Recent Labs  01/17/17 1039 01/19/17 0351  HGB 11.5* 8.6*  HCT 34.1* 25.8*    Assessment/Plan: Active Problems:   Labor and delivery, indication for care   Plan for discharge tomorrow  -- Continue routine PP care.     Doreene Burkennie Linnea Todisco, CNM 01/19/2017 9:37 AM

## 2017-01-19 NOTE — Lactation Note (Signed)
Lactation Consultation Note  Patient Name: Carrie Schwartz ZOXWR'UToday's Date: 01/19/2017 Reason for consult: Follow-up assessment   Maternal Data Has patient been taught Hand Expression?: Yes Does the patient have breastfeeding experience prior to this delivery?: No  Feeding Feeding Type: Breast Fed  LATCH Score Latch: Repeated attempts needed to sustain latch, nipple held in mouth throughout feeding, stimulation needed to elicit sucking reflex.  Audible Swallowing: A few with stimulation  Type of Nipple: Flat  Comfort (Breast/Nipple): Soft / non-tender  Hold (Positioning): Full assist, staff holds infant at breast  LATCH Score: 5  Interventions Interventions: Breast feeding basics reviewed;Assisted with latch;Skin to skin;Hand express;Support pillows;Position options  Lactation Tools Discussed/Used     Consult Status Consult Status: Follow-up Follow-up type: In-patient    Trudee GripCarolyn P Jarmar Rousseau 01/19/2017, 3:13 PM

## 2017-01-20 MED ORDER — FERROUS SULFATE 325 (65 FE) MG PO TABS: 325.0000 mg | ORAL_TABLET | Freq: Every day | ORAL | 3 refills | Status: AC

## 2017-01-20 MED ORDER — BENZOCAINE-MENTHOL 20-0.5 % EX AERO: 1.0000 "application " | INHALATION_SPRAY | CUTANEOUS | 0 refills | Status: AC | PRN

## 2017-01-20 MED ORDER — DOCUSATE SODIUM 100 MG PO CAPS: 100.0000 mg | ORAL_CAPSULE | Freq: Two times a day (BID) | ORAL | 0 refills | Status: AC

## 2017-01-20 MED ORDER — IBUPROFEN 600 MG PO TABS: 600.0000 mg | ORAL_TABLET | Freq: Four times a day (QID) | ORAL | 0 refills | Status: DC

## 2017-01-20 NOTE — Final Progress Note (Signed)
Discharge Day SOAP Note:  Progress Note - Vaginal Delivery  Carrie Schwartz is a 31 y.o. [id number] now PP day 2 s/p Vaginal, Spontaneous Delivery . Delivery was complicated by preterm delivery   Subjective  The patient has the following complaints: has no unusual complaints  Pain is controlled with current medications.   Patient is urinating without difficulty.  She is ambulating well.    Objective  Vital signs: BP 119/80   Pulse 93   Temp 97.9 F (36.6 C) (Oral)   Resp 18   Ht 5\' 7"  (1.702 m)   Wt 162 lb (73.5 kg)   LMP 05/13/2016 (Exact Date)   SpO2 99%   Breastfeeding? Unknown   BMI 25.37 kg/m   Physical Exam: Gen: NAD Lungs clear bilaterally Heart: RRR Bowel sounds present  Fundus Fundal Tone: Firm  Lochia Amount: Small  Perineum Appearance: Edematous                Data Review Labs: CBC Latest Ref Rng & Units 01/19/2017 01/17/2017 11/29/2016  WBC 3.6 - 11.0 K/uL 10.3 9.7 7.7  Hemoglobin 12.0 - 16.0 g/dL 2.8(U8.6(L) 11.5(L) 11.5  Hematocrit 35.0 - 47.0 % 25.8(L) 34.1(L) 35.1  Platelets 150 - 440 K/uL 137(L) 196 176   B POS  Assessment/Plan  Active Problems:   Labor and delivery, indication for care    Plan for discharge today.   Discharge Instructions: Per After Visit Summary. Activity: Advance as tolerated. Pelvic rest for 6 weeks.  Also refer to After Visit Summary Diet: Regular Medications: Allergies as of 01/20/2017   No Known Allergies             Medication List     TAKE these medications   amphetamine-dextroamphetamine 20 MG tablet Commonly known as:  ADDERALL TK 1 T PO TID PRN   benzocaine-Menthol 20-0.5 % Aero Commonly known as:  DERMOPLAST Apply 1 application topically as needed for irritation (perineal discomfort).   docusate sodium 100 MG capsule Commonly known as:  COLACE Take 1 capsule (100 mg total) by mouth 2 (two) times daily.   ferrous sulfate 325 (65 FE) MG tablet Take 1 tablet (325 mg total) by mouth  daily with breakfast.   ibuprofen 600 MG tablet Commonly known as:  ADVIL,MOTRIN Take 1 tablet (600 mg total) by mouth every 6 (six) hours.   multivitamin-prenatal 27-0.8 MG Tabs tablet Take 1 tablet by mouth daily at 12 noon.      Outpatient follow up:  Postpartum contraception: Will discuss at first office visit post-partum  Discharged Condition: good  Discharged to: home  Newborn Data: Disposition:home with mother  Apgars: APGAR (1 MIN): 8   APGAR (5 MINS): 8   APGAR (10 MINS):    Baby Feeding: Breast    Doreene Burkennie Jodee Wagenaar, CNM 01/20/2017 8:05 AM

## 2017-01-20 NOTE — Progress Notes (Signed)
Discharge instructions reviewed with patient.  All questions answered.  Follow up appointment scheduled. Pt will room in with baby in room 335

## 2017-01-20 NOTE — Lactation Note (Signed)
This note was copied from a baby's chart. Lactation Consultation Note  Patient Name: Carrie Schwartz ZOXWR'UToday's Date: 01/20/2017 Reason for consult: Follow-up assessment;Mother's request   Maternal Data  concerned about baby not latching well, pt encouraged To keep trying, will get easier with time and practice Feeding Feeding Type: Breast Fed Length of feed: 25 min (15 min on right with 20 mm shield, 10 min left with out shie) Baby very fussy at first then once calmed by swaddling falls asleep, can hear bowel sounds audibly, gassy, once stimulated again can latch easily once he coordinates tongue and suck.  Obvious swallows at breast, needed to be burped twice on right breast, spits up small amt when burps LATCH Score Latch: Repeated attempts needed to sustain latch, nipple held in mouth throughout feeding, stimulation needed to elicit sucking reflex. (fussy on right breast, does well once calmed)  Audible Swallowing: Spontaneous and intermittent  Type of Nipple: Flat, right nipple more flat, able to sandwich left areola to get latch without shield  Comfort (Breast/Nipple): Filling, red/small blisters or bruises, mild/mod discomfort  Hold (Positioning): Assistance needed to correctly position infant at breast and maintain latch.  LATCH Score: 6  Interventions Interventions: Assisted with latch;Breast massage;Breast compression;Adjust position;Support pillows;Position options  Lactation Tools Discussed/Used Nipple shield size: 20   Consult Status Consult Status: Follow-up Date: 01/21/17 Follow-up type: In-patient    Dyann KiefMarsha D Eliab Closson 01/20/2017, 5:02 PM

## 2017-01-20 NOTE — Lactation Note (Signed)
This note was copied from a baby's chart. Lactation Consultation Note  Patient Name: Carrie Leda Quailnas Maxson RUEAV'WToday's Date: 01/20/2017 Reason for consult: Follow-up assessment;Difficult latch;Primapara;Late-preterm 34-36.6wks   Maternal Data Formula Feeding for Exclusion: No Has patient been taught Hand Expression?: Yes Does the patient have breastfeeding experience prior to this delivery?: No Mom's breasts are filling, fullness around areola, able to latch baby easily to left breast with compression around areola, nursed well with many swallows, right nipple more flat and difficult to compress, full around areola, used shield on this side, baby had trouble coordinating suck, arches back and fussy, did settle and nurse approx. 15 min, off and on, with breast milk noted in shield when removed.     Feeding Feeding Type: Breast Fed Length of feed: 30 min  LATCH Score Latch: Grasps breast easily, tongue down, lips flanged, rhythmical sucking. (latched easily to left breast, several attempts on right)  Audible Swallowing: Spontaneous and intermittent  Type of Nipple: Flat  Comfort (Breast/Nipple): Filling, red/small blisters or bruises, mild/mod discomfort (filling)  Hold (Positioning): Assistance needed to correctly position infant at breast and maintain latch.  LATCH Score: 7  Interventions Interventions: Assisted with latch;Breast massage;Hand express;Pre-pump if needed;Breast compression;Adjust position;Position options;DEBP  Lactation Tools Discussed/Used Tools: Pump;Nipple Shields Nipple shield size: 20 Breast pump type: Double-Electric Breast Pump WIC Program: No   Consult Status Consult Status: Follow-up Date: 01/20/17 Follow-up type: In-patient    Carrie Schwartz 01/20/2017, 1:54 PM

## 2017-01-20 NOTE — Discharge Summary (Signed)
                             Discharge Summary  Date of Admission: 01/17/2017  Date of Discharge: 01/20/17  Admitting Diagnosis: Preterm premature Rupture of Membranes at 5393w5d  Mode of Delivery: normal spontaneous vaginal delivery                 Discharge Diagnosis: No other diagnosis   Intrapartum Procedures: curettage, epidural, laceration 2nd and prophylactic antibiotic    Post partum procedures: none  Complications: 2 degree perineal laceration, right side wall and left labial                       Discharge Day SOAP Note:  Progress Note - Vaginal Delivery  Carrie Quailnas Strange is a 31 y.o. [id number] now PP day 2 s/p Vaginal, Spontaneous Delivery . Delivery was complicated by preterm delivery   Subjective  The patient has the following complaints: has no unusual complaints  Pain is controlled with current medications.   Patient is urinating without difficulty.  She is ambulating well.    Objective  Vital signs: BP 119/80   Pulse 93   Temp 97.9 F (36.6 C) (Oral)   Resp 18   Ht 5\' 7"  (1.702 m)   Wt 162 lb (73.5 kg)   LMP 05/13/2016 (Exact Date)   SpO2 99%   Breastfeeding? Unknown   BMI 25.37 kg/m   Physical Exam: Gen: NAD Lungs clear bilaterally Heart: RRR Bowel sounds present  Fundus Fundal Tone: Firm  Lochia Amount: Small  Perineum Appearance: Edematous     Data Review Labs: CBC Latest Ref Rng & Units 01/19/2017 01/17/2017 11/29/2016  WBC 3.6 - 11.0 K/uL 10.3 9.7 7.7  Hemoglobin 12.0 - 16.0 g/dL 1.4(N8.6(L) 11.5(L) 11.5  Hematocrit 35.0 - 47.0 % 25.8(L) 34.1(L) 35.1  Platelets 150 - 440 K/uL 137(L) 196 176   B POS  Assessment/Plan  Active Problems:   Labor and delivery, indication for care    Plan for discharge today.   Discharge Instructions: Per After Visit Summary. Activity: Advance as tolerated. Pelvic rest for 6 weeks.  Also refer to After Visit Summary Diet: Regular Medications: Allergies as of 01/20/2017   No Known Allergies      Medication List    TAKE these medications   amphetamine-dextroamphetamine 20 MG tablet Commonly known as:  ADDERALL TK 1 T PO TID PRN   benzocaine-Menthol 20-0.5 % Aero Commonly known as:  DERMOPLAST Apply 1 application topically as needed for irritation (perineal discomfort).   docusate sodium 100 MG capsule Commonly known as:  COLACE Take 1 capsule (100 mg total) by mouth 2 (two) times daily.   ferrous sulfate 325 (65 FE) MG tablet Take 1 tablet (325 mg total) by mouth daily with breakfast.   ibuprofen 600 MG tablet Commonly known as:  ADVIL,MOTRIN Take 1 tablet (600 mg total) by mouth every 6 (six) hours.   multivitamin-prenatal 27-0.8 MG Tabs tablet Take 1 tablet by mouth daily at 12 noon.      Outpatient follow up:  Postpartum contraception: Will discuss at first office visit post-partum  Discharged Condition: good  Discharged to: home  Newborn Data: Disposition:home with mother  Apgars: APGAR (1 MIN): 8   APGAR (5 MINS): 8   APGAR (10 MINS):    Baby Feeding: Breast    Doreene Burkennie Manuelita Moxon, CNM 01/20/2017 8:05 AM

## 2017-01-23 ENCOUNTER — Encounter: Payer: No Typology Code available for payment source | Admitting: Certified Nurse Midwife

## 2017-01-24 ENCOUNTER — Ambulatory Visit: Payer: Self-pay

## 2017-01-24 NOTE — Lactation Note (Signed)
This note was copied from a baby's chart. Lactation Consultation Note  Patient Name: Carrie Schwartz ZOXWR'UToday's Date: 01/24/2017   Mom and baby were discharged earlier today. Mom said she was strictly pumping and bottle feeding baby. I reviewed pump and bottle feed instructions and [facility] and [facility]. Hours after I had seen her and she had gone home, I saw that she left breast milk in fridge in SCN. I LM on her phone to call us with her plans to retrieve it, and  If not picked up before expiration date, it will be discarded.   Maternal Data    Feeding    LATCH Score                   Interventions    Lactation Tools Discussed/Used     Consult Status      Sunday CornSandra Clark Coltyn Hanning 01/24/2017, 4:01 PM

## 2017-01-26 ENCOUNTER — Encounter: Payer: Self-pay | Admitting: Certified Nurse Midwife

## 2017-03-01 ENCOUNTER — Encounter: Payer: Self-pay | Admitting: Certified Nurse Midwife

## 2017-03-01 ENCOUNTER — Ambulatory Visit (INDEPENDENT_AMBULATORY_CARE_PROVIDER_SITE_OTHER): Payer: No Typology Code available for payment source | Admitting: Certified Nurse Midwife

## 2017-03-01 DIAGNOSIS — K649 Unspecified hemorrhoids: Secondary | ICD-10-CM | POA: Insufficient documentation

## 2017-03-01 NOTE — Progress Notes (Signed)
Subjective:    Carrie Schwartz is a 31 y.o. [id number] Mediterranean female who presents for a postpartum visit. She is 6 weeks postpartum following a spontaneous vaginal delivery at 35.5 gestational weeks. Anesthesia: epidural. I have fully reviewed the prenatal and intrapartum course. Postpartum course has been normal. Baby's course has been normal. Baby is feeding by bottle. Bleeding no bleeding. Bowel function is normal. Bladder function is normal. Patient is not sexually active. Last sexual activity: prior to delivery . Contraception method is condoms. Postpartum depression screening: negative.  Last pap 2016 and was normal .  The following portions of the patient's history were reviewed and updated as appropriate: allergies, current medications, past medical history, past surgical history and problem list.  Review of Systems Pertinent items are noted in HPI.   Vitals:   03/01/17 0848  BP: 111/79  Pulse: 69  Weight: 151 lb (68.5 kg)  Height: 5\' 7"  (1.702 m)   Patient's last menstrual period was 05/13/2016 (exact date).  Objective:   General:  alert, cooperative and no distress   Breasts:  deferred, no complaints  Lungs: clear to auscultation bilaterally  Heart:  regular rate and rhythm  Abdomen: soft, nontender   Vulva: normal  Vagina: normal vagina  Cervix:  closed  Corpus: Well-involuted  Adnexa:  Non-palpable  Rectal Exam: Present- hemorrhoids        Assessment:   Postpartum exam 6 wks s/p NSVD  Bottle feeding Depression screening negative Contraception counseling   Plan:  : condoms Follow up in: 6 months for annual exam or earlier if needed Referral placed for hemorrhoids   Doreene BurkeAnnie Jericho Cieslik, CNM

## 2017-03-01 NOTE — Patient Instructions (Signed)
Preventive Care 18-39 Years, Female Preventive care refers to lifestyle choices and visits with your health care provider that can promote health and wellness. What does preventive care include?  A yearly physical exam. This is also called an annual well check.  Dental exams once or twice a year.  Routine eye exams. Ask your health care provider how often you should have your eyes checked.  Personal lifestyle choices, including: ? Daily care of your teeth and gums. ? Regular physical activity. ? Eating a healthy diet. ? Avoiding tobacco and drug use. ? Limiting alcohol use. ? Practicing safe sex. ? Taking vitamin and mineral supplements as recommended by your health care provider. What happens during an annual well check? The services and screenings done by your health care provider during your annual well check will depend on your age, overall health, lifestyle risk factors, and family history of disease. Counseling Your health care provider may ask you questions about your:  Alcohol use.  Tobacco use.  Drug use.  Emotional well-being.  Home and relationship well-being.  Sexual activity.  Eating habits.  Work and work Statistician.  Method of birth control.  Menstrual cycle.  Pregnancy history.  Screening You may have the following tests or measurements:  Height, weight, and BMI.  Diabetes screening. This is done by checking your blood sugar (glucose) after you have not eaten for a while (fasting).  Blood pressure.  Lipid and cholesterol levels. These may be checked every 5 years starting at age 66.  Skin check.  Hepatitis C blood test.  Hepatitis B blood test.  Sexually transmitted disease (STD) testing.  BRCA-related cancer screening. This may be done if you have a family history of breast, ovarian, tubal, or peritoneal cancers.  Pelvic exam and Pap test. This may be done every 3 years starting at age 40. Starting at age 59, this may be done every 5  years if you have a Pap test in combination with an HPV test.  Discuss your test results, treatment options, and if necessary, the need for more tests with your health care provider. Vaccines Your health care provider may recommend certain vaccines, such as:  Influenza vaccine. This is recommended every year.  Tetanus, diphtheria, and acellular pertussis (Tdap, Td) vaccine. You may need a Td booster every 10 years.  Varicella vaccine. You may need this if you have not been vaccinated.  HPV vaccine. If you are 69 or younger, you may need three doses over 6 months.  Measles, mumps, and rubella (MMR) vaccine. You may need at least one dose of MMR. You may also need a second dose.  Pneumococcal 13-valent conjugate (PCV13) vaccine. You may need this if you have certain conditions and were not previously vaccinated.  Pneumococcal polysaccharide (PPSV23) vaccine. You may need one or two doses if you smoke cigarettes or if you have certain conditions.  Meningococcal vaccine. One dose is recommended if you are age 27-21 years and a first-year college student living in a residence hall, or if you have one of several medical conditions. You may also need additional booster doses.  Hepatitis A vaccine. You may need this if you have certain conditions or if you travel or work in places where you may be exposed to hepatitis A.  Hepatitis B vaccine. You may need this if you have certain conditions or if you travel or work in places where you may be exposed to hepatitis B.  Haemophilus influenzae type b (Hib) vaccine. You may need this if  you have certain risk factors.  Talk to your health care provider about which screenings and vaccines you need and how often you need them. This information is not intended to replace advice given to you by your health care provider. Make sure you discuss any questions you have with your health care provider. Document Released: 05/03/2001 Document Revised: 11/25/2015  Document Reviewed: 01/06/2015 Elsevier Interactive Patient Education  2017 [employer].

## 2017-03-08 ENCOUNTER — Encounter: Payer: Self-pay | Admitting: *Deleted

## 2017-03-28 ENCOUNTER — Ambulatory Visit: Payer: Self-pay | Admitting: General Surgery

## 2017-04-03 ENCOUNTER — Encounter: Payer: Self-pay | Admitting: *Deleted

## 2017-04-20 ENCOUNTER — Encounter: Payer: Self-pay | Admitting: *Deleted

## 2017-04-27 ENCOUNTER — Ambulatory Visit: Payer: Self-pay | Admitting: Nurse Practitioner

## 2017-05-16 ENCOUNTER — Ambulatory Visit: Payer: No Typology Code available for payment source | Admitting: Internal Medicine

## 2017-05-16 ENCOUNTER — Encounter: Payer: Self-pay | Admitting: Internal Medicine

## 2017-05-16 VITALS — BP 115/81 | HR 88 | Resp 16 | Ht 67.0 in | Wt 136.0 lb

## 2017-05-16 DIAGNOSIS — F988 Other specified behavioral and emotional disorders with onset usually occurring in childhood and adolescence: Secondary | ICD-10-CM | POA: Diagnosis not present

## 2017-05-16 MED ORDER — AMPHETAMINE-DEXTROAMPHETAMINE 20 MG PO TABS: ORAL_TABLET | ORAL | 0 refills | Status: DC

## 2017-05-16 NOTE — Progress Notes (Signed)
[facility] [address] [place], [place] [address]  Internal MEDICINE  Office Visit Note  Patient Name: Carrie Schwartz  [id number]  [id number]  Date of Service: 06/13/2017  Chief Complaint  Patient presents with  . ADD    HPI  Pt is here for routine follow up. She continues to be on hight dose of Adderall, will like her to taper down her dose or see Psych in future   Current Medication: Outpatient Encounter Medications as of 05/16/2017  Medication Sig  . amphetamine-dextroamphetamine (ADDERALL) 20 MG tablet Take one tab po tid  . benzocaine-Menthol (DERMOPLAST) 20-0.5 % AERO Apply 1 application topically as needed for irritation (perineal discomfort).  . docusate sodium (COLACE) 100 MG capsule Take 1 capsule (100 mg total) by mouth 2 (two) times daily.  . ferrous sulfate 325 (65 FE) MG tablet Take 1 tablet (325 mg total) by mouth daily with breakfast.  . ibuprofen (ADVIL,MOTRIN) 600 MG tablet Take 1 tablet (600 mg total) by mouth every 6 (six) hours.  . Prenatal Vit-Fe Fumarate-FA (MULTIVITAMIN-PRENATAL) 27-0.8 MG TABS tablet Take 1 tablet by mouth daily at 12 noon.  . [DISCONTINUED] amphetamine-dextroamphetamine (ADDERALL) 20 MG tablet TK 1 T PO TID PRN  . [DISCONTINUED] IRON PO Take by mouth.   No facility-administered encounter medications on file as of 05/16/2017.     Surgical History: History reviewed. No pertinent surgical history.  Medical History: Past Medical History:  Diagnosis Date  . ADHD   . Atopic rhinitis     Family History: Family History  Problem Relation Age of Onset  . Thrombocytopenia Mother   . Hypertension Father   . Hyperlipidemia Father   . Diabetes Neg Hx   . Stroke Neg Hx     Social History   Socioeconomic History  . Marital status: Single    Spouse name: Not on file  . Number of children: Not on file  . Years of education: Not on file  . Highest education level: Not on file  Occupational History  . Not on file   Social Needs  . Financial resource strain: Not on file  . Food insecurity:    Worry: Not on file    Inability: Not on file  . Transportation needs:    Medical: Not on file    Non-medical: Not on file  Tobacco Use  . Smoking status: Former Smoker    Last attempt to quit: 03/21/2006    Years since quitting: 11.2  . Smokeless tobacco: Never Used  Substance and Sexual Activity  . Alcohol use: No  . Drug use: No  . Sexual activity: Not Currently    Birth control/protection: None  Lifestyle  . Physical activity:    Days per week: Not on file    Minutes per session: Not on file  . Stress: Not on file  Relationships  . Social connections:    Talks on phone: Not on file    Gets together: Not on file    Attends religious service: Not on file    Active member of club or organization: Not on file    Attends meetings of clubs or organizations: Not on file    Relationship status: Not on file  . Intimate partner violence:    Fear of current or ex partner: Not on file    Emotionally abused: Not on file    Physically abused: Not on file    Forced sexual activity: Not on file  Other Topics Concern  . Not  on file  Social History Narrative  . Not on file    Review of Systems  Constitutional: Negative for chills, fatigue and unexpected weight change.  HENT: Negative for congestion, postnasal drip, rhinorrhea, sneezing and sore throat.   Eyes: Negative for redness.  Respiratory: Negative for cough.   Cardiovascular: Positive for palpitations. Negative for chest pain.  Gastrointestinal: Negative for abdominal pain and nausea.  Genitourinary: Negative for dysuria and frequency.  Skin: Negative for rash.  Neurological: Negative.  Negative for tremors and numbness.  Psychiatric/Behavioral: Negative for behavioral problems (Depression), sleep disturbance and suicidal ideas. The patient is not nervous/anxious.    Vital Signs: BP 115/81 (BP Location: Left Arm, Patient Position: Sitting)    Pulse 88   Resp 16   Ht 5\' 7"  (1.702 m)   Wt 136 lb (61.7 kg)   SpO2 95%   BMI 21.30 kg/m    Physical Exam  Constitutional: She is oriented to person, place, and time. She appears well-developed and well-nourished. No distress.  HENT:  Head: Normocephalic and atraumatic.  Mouth/Throat: Oropharynx is clear and moist. No oropharyngeal exudate.  Eyes: Pupils are equal, round, and reactive to light. EOM are normal.  Neck: Normal range of motion. Neck supple. No JVD present. No tracheal deviation present. No thyromegaly present.  Cardiovascular: Normal rate, regular rhythm and normal heart sounds. Exam reveals no gallop.  No murmur heard. Pulmonary/Chest: Effort normal. She has no rales. She exhibits no tenderness.  Abdominal: Soft. Bowel sounds are normal.  Musculoskeletal: Normal range of motion.  Lymphadenopathy:    She has no cervical adenopathy.  Neurological: She is alert and oriented to person, place, and time. No cranial nerve deficit.  Skin: Skin is warm and dry. She is not diaphoretic.  Psychiatric: She has a normal mood and affect. Her behavior is normal. Judgment and thought content normal.   Assessment/Plan: 1. Attention deficit disorder (ADD) in adult - amphetamine-dextroamphetamine (ADDERALL) 20 MG tablet; Take one tab po tid  Dispense: 90 tablet; Refill: 0  General Counseling: Pearl verbalizes understanding of the findings of todays visit and agrees with plan of treatment. I have discussed any further diagnostic evaluation that may be needed or ordered today. We also reviewed her medications today. she has been encouraged to call the office with any questions or concerns that should arise related to todays visit. monitor drug abuse potential    Meds ordered this encounter  Medications  . amphetamine-dextroamphetamine (ADDERALL) 20 MG tablet    Sig: Take one tab po tid    Dispense:  90 tablet    Refill:  0    Time spent:20 Minutes   Dr Lyndon CodeFozia M Zai Chmiel Internal  medicine

## 2017-08-21 ENCOUNTER — Ambulatory Visit: Payer: No Typology Code available for payment source | Admitting: Nurse Practitioner

## 2017-08-21 ENCOUNTER — Encounter: Payer: Self-pay | Admitting: Nurse Practitioner

## 2017-08-21 VITALS — BP 113/80 | HR 87 | Resp 16 | Ht 67.0 in | Wt 130.0 lb

## 2017-08-21 DIAGNOSIS — R5383 Other fatigue: Secondary | ICD-10-CM | POA: Diagnosis not present

## 2017-08-21 DIAGNOSIS — F988 Other specified behavioral and emotional disorders with onset usually occurring in childhood and adolescence: Secondary | ICD-10-CM

## 2017-08-21 MED ORDER — AMPHETAMINE-DEXTROAMPHETAMINE 30 MG PO TABS
30.0000 mg | ORAL_TABLET | Freq: Two times a day (BID) | ORAL | 0 refills | Status: DC
Start: 2017-08-21 — End: 2017-08-21

## 2017-08-21 MED ORDER — AMPHETAMINE-DEXTROAMPHETAMINE 30 MG PO TABS: 30.0000 mg | ORAL_TABLET | Freq: Two times a day (BID) | ORAL | 0 refills | Status: DC

## 2017-08-21 NOTE — Progress Notes (Signed)
[facility] [address] [place], [place] [address]  Internal MEDICINE  Office Visit Note  Patient Name: Carrie Schwartz  [id number]  [id number]  Date of Service: 09/06/2017    Pt is here for routine follow up.   Chief Complaint  Patient presents with  . Follow-up    The patient is currently taking adderall 20mg  every morning. She has infant, works full time, and is now in nursing school. Taking the adderall in the mornings has helped her for the first part of the day. Finds it difficult to pay attention in her evening classes. Finds it difficult to stay on task and to stay awake during lectures.      Current Medication: Outpatient Encounter Medications as of 08/21/2017  Medication Sig  . amphetamine-dextroamphetamine (ADDERALL) 30 MG tablet Take 1 tablet by mouth 2 (two) times daily.  . benzocaine-Menthol (DERMOPLAST) 20-0.5 % AERO Apply 1 application topically as needed for irritation (perineal discomfort).  . docusate sodium (COLACE) 100 MG capsule Take 1 capsule (100 mg total) by mouth 2 (two) times daily. (Patient not taking: Reported on 08/30/2017)  . ferrous sulfate 325 (65 FE) MG tablet Take 1 tablet (325 mg total) by mouth daily with breakfast. (Patient not taking: Reported on 08/30/2017)  . ibuprofen (ADVIL,MOTRIN) 600 MG tablet Take 1 tablet (600 mg total) by mouth every 6 (six) hours.  . Prenatal Vit-Fe Fumarate-FA (MULTIVITAMIN-PRENATAL) 27-0.8 MG TABS tablet Take 1 tablet by mouth daily at 12 noon.  . [DISCONTINUED] amphetamine-dextroamphetamine (ADDERALL) 20 MG tablet Take one tab po tid  . [DISCONTINUED] amphetamine-dextroamphetamine (ADDERALL) 30 MG tablet Take 1 tablet by mouth 2 (two) times daily.  . [DISCONTINUED] amphetamine-dextroamphetamine (ADDERALL) 30 MG tablet Take 1 tablet by mouth 2 (two) times daily.   No facility-administered encounter medications on file as of 08/21/2017.     Surgical History: History reviewed. No pertinent surgical  history.  Medical History: Past Medical History:  Diagnosis Date  . ADHD   . Atopic rhinitis     Family History: Family History  Problem Relation Age of Onset  . Thrombocytopenia Mother   . Hypertension Father   . Hyperlipidemia Father   . Diabetes Neg Hx   . Stroke Neg Hx     Social History   Socioeconomic History  . Marital status: Single    Spouse name: Not on file  . Number of children: Not on file  . Years of education: Not on file  . Highest education level: Not on file  Occupational History  . Not on file  Social Needs  . Financial resource strain: Not on file  . Food insecurity:    Worry: Not on file    Inability: Not on file  . Transportation needs:    Medical: Not on file    Non-medical: Not on file  Tobacco Use  . Smoking status: Former Smoker    Last attempt to quit: 03/21/2006    Years since quitting: 11.4  . Smokeless tobacco: Never Used  Substance and Sexual Activity  . Alcohol use: No  . Drug use: No  . Sexual activity: Yes    Birth control/protection: None, Condom  Lifestyle  . Physical activity:    Days per week: Not on file    Minutes per session: Not on file  . Stress: Not on file  Relationships  . Social connections:    Talks on phone: Not on file    Gets together: Not on file    Attends religious service:  Not on file    Active member of club or organization: Not on file    Attends meetings of clubs or organizations: Not on file    Relationship status: Not on file  . Intimate partner violence:    Fear of current or ex partner: Not on file    Emotionally abused: Not on file    Physically abused: Not on file    Forced sexual activity: Not on file  Other Topics Concern  . Not on file  Social History Narrative  . Not on file      Review of Systems  Constitutional: Positive for fatigue. Negative for activity change and chills.  HENT: Negative for congestion, postnasal drip, rhinorrhea, sneezing and sore throat.   Eyes:  Negative.  Negative for redness.  Respiratory: Negative for cough, chest tightness and wheezing.   Cardiovascular: Negative for chest pain and palpitations.  Gastrointestinal: Negative for abdominal pain and nausea.  Endocrine: Negative for cold intolerance, heat intolerance, polydipsia, polyphagia and polyuria.  Genitourinary: Negative for dysuria and frequency.  Musculoskeletal: Negative for arthralgias, back pain, myalgias and neck pain.  Skin: Negative for rash.  Allergic/Immunologic: Negative for environmental allergies.  Neurological: Negative for dizziness, tremors, numbness and headaches.  Hematological: Negative for adenopathy.  Psychiatric/Behavioral: Positive for decreased concentration. Negative for behavioral problems (Depression), sleep disturbance and suicidal ideas. The patient is not nervous/anxious.     Today's Vitals   08/21/17 1127  BP: 113/80  Pulse: 87  Resp: 16  SpO2: 100%  Weight: 130 lb (59 kg)  Height: 5\' 7"  (1.702 m)    Physical Exam  Constitutional: She is oriented to person, place, and time. She appears well-developed and well-nourished. No distress.  HENT:  Head: Normocephalic and atraumatic.  Mouth/Throat: Oropharynx is clear and moist. No oropharyngeal exudate.  Eyes: Pupils are equal, round, and reactive to light. EOM are normal.  Neck: Normal range of motion. Neck supple. No JVD present. No tracheal deviation present. No thyromegaly present.  Cardiovascular: Normal rate, regular rhythm and normal heart sounds. Exam reveals no gallop.  No murmur heard. Pulmonary/Chest: Effort normal and breath sounds normal. She has no rales. She exhibits no tenderness.  Abdominal: Soft. Bowel sounds are normal. There is no tenderness.  Musculoskeletal: Normal range of motion.  Lymphadenopathy:    She has no cervical adenopathy.  Neurological: She is alert and oriented to person, place, and time. No cranial nerve deficit.  Skin: Skin is warm and dry. She is not  diaphoretic.  Psychiatric: She has a normal mood and affect. Her behavior is normal. Judgment and thought content normal.  Nursing note and vitals reviewed.  Assessment/Plan: 1. Other fatigue Discussed sleep hygiene  2. Attention deficit disorder (ADD) in adult Increase adderall to 20mg  twice daily if needed for focus and concentration. Advised her not to take if not needed. Three 30 day prescriptions were provided. Dates are 08/21/2017, 09/18/2017, and 10/17/2017 - amphetamine-dextroamphetamine (ADDERALL) 30 MG tablet; Take 1 tablet by mouth 2 (two) times daily.  Dispense: 60 tablet; Refill: 0  General Counseling: Braelynne verbalizes understanding of the findings of todays visit and agrees with plan of treatment. I have discussed any further diagnostic evaluation that may be needed or ordered today. We also reviewed her medications today. she has been encouraged to call the office with any questions or concerns that should arise related to todays visit.    Counseling:  This patient was seen by Vincent GrosHeather Gentri Guardado, FNP- C in Collaboration with Dr Shannan HarperFozia M  Welton Flakes as a part of collaborative care agreement    Meds ordered this encounter  Medications  . DISCONTD: amphetamine-dextroamphetamine (ADDERALL) 30 MG tablet    Sig: Take 1 tablet by mouth 2 (two) times daily.    Dispense:  60 tablet    Refill:  0    Order Specific Question:   Supervising Provider    Answer:   Lyndon Code [[id number]]  . DISCONTD: amphetamine-dextroamphetamine (ADDERALL) 30 MG tablet    Sig: Take 1 tablet by mouth 2 (two) times daily.    Dispense:  60 tablet    Refill:  0    Fill after 09/18/2017    Order Specific Question:   Supervising Provider    Answer:   Lyndon Code [[id number]]  . amphetamine-dextroamphetamine (ADDERALL) 30 MG tablet    Sig: Take 1 tablet by mouth 2 (two) times daily.    Dispense:  60 tablet    Refill:  0    Fill after 10/17/2017    Order Specific Question:   Supervising Provider    Answer:   Lyndon Code  [[id number]]    Time spent: 36 Minutes      Dr Lyndon Code Internal medicine

## 2017-08-30 ENCOUNTER — Ambulatory Visit (INDEPENDENT_AMBULATORY_CARE_PROVIDER_SITE_OTHER): Payer: No Typology Code available for payment source | Admitting: Certified Nurse Midwife

## 2017-08-30 ENCOUNTER — Encounter: Payer: Self-pay | Admitting: Certified Nurse Midwife

## 2017-08-30 VITALS — BP 102/73 | HR 81 | Ht 67.0 in | Wt 129.5 lb

## 2017-08-30 DIAGNOSIS — Z87440 Personal history of urinary (tract) infections: Secondary | ICD-10-CM

## 2017-08-30 DIAGNOSIS — Z01419 Encounter for gynecological examination (general) (routine) without abnormal findings: Secondary | ICD-10-CM | POA: Diagnosis not present

## 2017-08-30 LAB — POCT URINALYSIS DIPSTICK
BILIRUBIN UA: NEGATIVE
Glucose, UA: NEGATIVE
Ketones, UA: NEGATIVE
NITRITE UA: NEGATIVE
PH UA: 5 (ref 5.0–8.0)
PROTEIN UA: NEGATIVE
Spec Grav, UA: 1.015 (ref 1.010–1.025)
UROBILINOGEN UA: 0.2 U/dL

## 2017-08-30 MED ORDER — NITROFURANTOIN MONOHYD MACRO 100 MG PO CAPS: 100.0000 mg | ORAL_CAPSULE | Freq: Two times a day (BID) | ORAL | 0 refills | Status: AC

## 2017-08-30 NOTE — Addendum Note (Signed)
Addended by: Brooke DareSICK, Tajon Moring L on: 08/30/2017 03:50 PM   Modules accepted: Orders

## 2017-08-30 NOTE — Patient Instructions (Signed)
Preventive Care 18-39 Years, Female Preventive care refers to lifestyle choices and visits with your health care provider that can promote health and wellness. What does preventive care include?  A yearly physical exam. This is also called an annual well check.  Dental exams once or twice a year.  Routine eye exams. Ask your health care provider how often you should have your eyes checked.  Personal lifestyle choices, including: ? Daily care of your teeth and gums. ? Regular physical activity. ? Eating a healthy diet. ? Avoiding tobacco and drug use. ? Limiting alcohol use. ? Practicing safe sex. ? Taking vitamin and mineral supplements as recommended by your health care provider. What happens during an annual well check? The services and screenings done by your health care provider during your annual well check will depend on your age, overall health, lifestyle risk factors, and family history of disease. Counseling Your health care provider may ask you questions about your:  Alcohol use.  Tobacco use.  Drug use.  Emotional well-being.  Home and relationship well-being.  Sexual activity.  Eating habits.  Work and work Statistician.  Method of birth control.  Menstrual cycle.  Pregnancy history.  Screening You may have the following tests or measurements:  Height, weight, and BMI.  Diabetes screening. This is done by checking your blood sugar (glucose) after you have not eaten for a while (fasting).  Blood pressure.  Lipid and cholesterol levels. These may be checked every 5 years starting at age 66.  Skin check.  Hepatitis C blood test.  Hepatitis B blood test.  Sexually transmitted disease (STD) testing.  BRCA-related cancer screening. This may be done if you have a family history of breast, ovarian, tubal, or peritoneal cancers.  Pelvic exam and Pap test. This may be done every 3 years starting at age 40. Starting at age 59, this may be done every 5  years if you have a Pap test in combination with an HPV test.  Discuss your test results, treatment options, and if necessary, the need for more tests with your health care provider. Vaccines Your health care provider may recommend certain vaccines, such as:  Influenza vaccine. This is recommended every year.  Tetanus, diphtheria, and acellular pertussis (Tdap, Td) vaccine. You may need a Td booster every 10 years.  Varicella vaccine. You may need this if you have not been vaccinated.  HPV vaccine. If you are 69 or younger, you may need three doses over 6 months.  Measles, mumps, and rubella (MMR) vaccine. You may need at least one dose of MMR. You may also need a second dose.  Pneumococcal 13-valent conjugate (PCV13) vaccine. You may need this if you have certain conditions and were not previously vaccinated.  Pneumococcal polysaccharide (PPSV23) vaccine. You may need one or two doses if you smoke cigarettes or if you have certain conditions.  Meningococcal vaccine. One dose is recommended if you are age 27-21 years and a first-year college student living in a residence hall, or if you have one of several medical conditions. You may also need additional booster doses.  Hepatitis A vaccine. You may need this if you have certain conditions or if you travel or work in places where you may be exposed to hepatitis A.  Hepatitis B vaccine. You may need this if you have certain conditions or if you travel or work in places where you may be exposed to hepatitis B.  Haemophilus influenzae type b (Hib) vaccine. You may need this if  you have certain risk factors.  Talk to your health care provider about which screenings and vaccines you need and how often you need them. This information is not intended to replace advice given to you by your health care provider. Make sure you discuss any questions you have with your health care provider. Document Released: 05/03/2001 Document Revised: 11/25/2015  Document Reviewed: 01/06/2015 Elsevier Interactive Patient Education  [employer].

## 2017-08-30 NOTE — Progress Notes (Signed)
GYNECOLOGY ANNUAL PREVENTATIVE CARE ENCOUNTER NOTE  Subjective:   Carrie Schwartz is a 32 y.o. [phone] female here for a routine annual gynecologic exam.  Current complaints: UTI symptoms. Urgency , frequency and flank pain.    Denies abnormal vaginal bleeding, discharge, pelvic pain, problems with intercourse or other gynecologic concerns.    Gynecologic History No LMP recorded. Contraception: condoms Last Pap: 08/11/2016. Results were:  Negative with negative HPV Last mammogram: N/a.  Obstetric History OB History  Gravida Para Term Preterm AB Living  2 1   1 1 1   SAB TAB Ectopic Multiple Live Births  1     0 1    # Outcome Date GA Lbr Len/2nd Weight Sex Delivery Anes PTL Lv  2 Preterm 01/18/17 6615w5d / 00:55 7 lb 0.2 oz (3.18 kg) M Vag-Spont EPI  LIV  1 SAB  1639w0d    SAB       Past Medical History:  Diagnosis Date  . ADHD   . Atopic rhinitis     No past surgical history on file.  Current Outpatient Medications on File Prior to Visit  Medication Sig Dispense Refill  . amphetamine-dextroamphetamine (ADDERALL) 30 MG tablet Take 1 tablet by mouth 2 (two) times daily. 60 tablet 0  . benzocaine-Menthol (DERMOPLAST) 20-0.5 % AERO Apply 1 application topically as needed for irritation (perineal discomfort). 1 each 0  . docusate sodium (COLACE) 100 MG capsule Take 1 capsule (100 mg total) by mouth 2 (two) times daily. 10 capsule 0  . ferrous sulfate 325 (65 FE) MG tablet Take 1 tablet (325 mg total) by mouth daily with breakfast. 30 tablet 3  . ibuprofen (ADVIL,MOTRIN) 600 MG tablet Take 1 tablet (600 mg total) by mouth every 6 (six) hours. 30 tablet 0  . Prenatal Vit-Fe Fumarate-FA (MULTIVITAMIN-PRENATAL) 27-0.8 MG TABS tablet Take 1 tablet by mouth daily at 12 noon.     No current facility-administered medications on file prior to visit.     No Known Allergies  Social History   Socioeconomic History  . Marital status: Single    Spouse name: Not on file  . Number of children:  Not on file  . Years of education: Not on file  . Highest education level: Not on file  Occupational History  . Not on file  Social Needs  . Financial resource strain: Not on file  . Food insecurity:    Worry: Not on file    Inability: Not on file  . Transportation needs:    Medical: Not on file    Non-medical: Not on file  Tobacco Use  . Smoking status: Former Smoker    Last attempt to quit: 03/21/2006    Years since quitting: 11.4  . Smokeless tobacco: Never Used  Substance and Sexual Activity  . Alcohol use: No  . Drug use: No  . Sexual activity: Not Currently    Birth control/protection: None  Lifestyle  . Physical activity:    Days per week: Not on file    Minutes per session: Not on file  . Stress: Not on file  Relationships  . Social connections:    Talks on phone: Not on file    Gets together: Not on file    Attends religious service: Not on file    Active member of club or organization: Not on file    Attends meetings of clubs or organizations: Not on file    Relationship status: Not on file  . Intimate partner violence:  Fear of current or ex partner: Not on file    Emotionally abused: Not on file    Physically abused: Not on file    Forced sexual activity: Not on file  Other Topics Concern  . Not on file  Social History Narrative  . Not on file    Family History  Problem Relation Age of Onset  . Thrombocytopenia Mother   . Hypertension Father   . Hyperlipidemia Father   . Diabetes Neg Hx   . Stroke Neg Hx     The following portions of the patient's history were reviewed and updated as appropriate: allergies, current medications, past family history, past medical history, past social history, past surgical history and problem list.  Review of Systems Pertinent items noted in HPI and remainder of comprehensive ROS otherwise negative.   Objective:  There were no vitals taken for this visit. CONSTITUTIONAL: Well-developed, well-nourished female in  no acute distress.  HENT:  Normocephalic, atraumatic, External right and left ear normal. Oropharynx is clear and moist EYES: Conjunctivae and EOM are normal. Pupils are equal, round, and reactive to light. No scleral icterus.  NECK: Normal range of motion, supple, no masses.  Normal thyroid.  SKIN: Skin is warm and dry. No rash noted. Not diaphoretic. No erythema. No pallor. MUSCULOSKELETAL: Normal range of motion. No tenderness.  No cyanosis, clubbing, or edema.  2+ distal pulses. NEUROLOGIC: Alert and oriented to person, place, and time. Normal reflexes, muscle tone coordination. No cranial nerve deficit noted. PSYCHIATRIC: Normal mood and affect. Normal behavior. Normal judgment and thought content. CARDIOVASCULAR: Normal heart rate noted, regular rhythm RESPIRATORY: Clear to auscultation bilaterally. Effort and breath sounds normal, no problems with respiration noted. BREASTS: Symmetric in size. No masses, skin changes, nipple drainage, or lymphadenopathy. CVA tenderness: negative ABDOMEN: Soft, normal bowel sounds, no distention noted.  No tenderness, rebound or guarding.  PELVIC: Normal appearing external genitalia; normal appearing vaginal mucosa and cervix.  No abnormal discharge noted.  Pap smear not indicated  Normal uterine size, no other palpable masses, no uterine or adnexal tenderness.    Assessment and Plan:  Well women exam  Labs: cholesterol panel  Meds: Macrobid  Routine preventative health maintenance measures emphasized. Please refer to After Visit Summary for other counseling recommendations.    Doreene Burke, CNM

## 2017-08-30 NOTE — Progress Notes (Signed)
Pt is here for a pap smear. Also c/o UTI symptoms.

## 2017-08-31 LAB — LIPID PANEL
Chol/HDL Ratio: 2 ratio (ref 0.0–4.4)
Cholesterol, Total: 170 mg/dL (ref 100–199)
HDL: 85 mg/dL (ref >39)
LDL CALC: 66 mg/dL (ref 0–99)
Triglycerides: 96 mg/dL (ref 0–149)
VLDL CHOLESTEROL CAL: 19 mg/dL (ref 5–40)

## 2017-09-01 LAB — URINE CULTURE

## 2017-09-06 DIAGNOSIS — F988 Other specified behavioral and emotional disorders with onset usually occurring in childhood and adolescence: Secondary | ICD-10-CM | POA: Insufficient documentation

## 2017-09-06 DIAGNOSIS — R5383 Other fatigue: Secondary | ICD-10-CM | POA: Insufficient documentation

## 2017-11-21 ENCOUNTER — Ambulatory Visit: Payer: No Typology Code available for payment source | Admitting: Nurse Practitioner

## 2017-11-21 ENCOUNTER — Telehealth: Payer: Self-pay

## 2017-11-21 ENCOUNTER — Encounter: Payer: Self-pay | Admitting: Nurse Practitioner

## 2017-11-21 VITALS — BP 104/76 | HR 92 | Resp 16 | Ht 67.0 in | Wt 130.8 lb

## 2017-11-21 DIAGNOSIS — F988 Other specified behavioral and emotional disorders with onset usually occurring in childhood and adolescence: Secondary | ICD-10-CM | POA: Diagnosis not present

## 2017-11-21 DIAGNOSIS — N946 Dysmenorrhea, unspecified: Secondary | ICD-10-CM | POA: Diagnosis not present

## 2017-11-21 MED ORDER — AMPHETAMINE-DEXTROAMPHETAMINE 30 MG PO TABS: 30.0000 mg | ORAL_TABLET | Freq: Two times a day (BID) | ORAL | 0 refills | Status: DC

## 2017-11-21 MED ORDER — IBUPROFEN 800 MG PO TABS: 800.0000 mg | ORAL_TABLET | Freq: Three times a day (TID) | ORAL | 2 refills | Status: AC | PRN

## 2017-11-21 MED ORDER — AMPHETAMINE-DEXTROAMPHETAMINE 20 MG PO TABS: 20.0000 mg | ORAL_TABLET | Freq: Two times a day (BID) | ORAL | 0 refills | Status: DC

## 2017-11-21 NOTE — Progress Notes (Signed)
[facility] [address] [place], [place] [address]  Internal MEDICINE  Office Visit Note   Patient Name: Carrie Schwartz  [id number]  [id number]  Date of Service: 11/22/2017  Chief Complaint  Patient presents with  . Medical Management of Chronic Issues    6month follow up medication refill    The patient is currently taking adderall 30mg  twice daily when needed She has infant, works full time, and is now in nursing school. Doing well with this higher dosing. Does not need to take full dose all the time. Helps to keep her focused and on track  Has intermittent menstrual cramps. Takes tylenol as needed but not really helping.       Current Medication: Outpatient Encounter Medications as of 11/21/2017  Medication Sig  . amphetamine-dextroamphetamine (ADDERALL) 30 MG tablet Take 1 tablet by mouth 2 (two) times daily.  . benzocaine-Menthol (DERMOPLAST) 20-0.5 % AERO Apply 1 application topically as needed for irritation (perineal discomfort).  . Prenatal Vit-Fe Fumarate-FA (MULTIVITAMIN-PRENATAL) 27-0.8 MG TABS tablet Take 1 tablet by mouth daily at 12 noon.  . [DISCONTINUED] amphetamine-dextroamphetamine (ADDERALL) 30 MG tablet Take 1 tablet by mouth 2 (two) times daily.  . [DISCONTINUED] amphetamine-dextroamphetamine (ADDERALL) 30 MG tablet Take 1 tablet by mouth 2 (two) times daily.  . [DISCONTINUED] amphetamine-dextroamphetamine (ADDERALL) 30 MG tablet Take 1 tablet by mouth 2 (two) times daily.  . [DISCONTINUED] ibuprofen (ADVIL,MOTRIN) 600 MG tablet Take 1 tablet (600 mg total) by mouth every 6 (six) hours.  Marland Kitchen amphetamine-dextroamphetamine (ADDERALL) 20 MG tablet Take 1 tablet (20 mg total) by mouth 2 (two) times daily.  Marland Kitchen docusate sodium (COLACE) 100 MG capsule Take 1 capsule (100 mg total) by mouth 2 (two) times daily. (Patient not taking: Reported on 08/30/2017)  . ferrous sulfate 325 (65 FE) MG tablet Take 1 tablet (325 mg total) by mouth daily with breakfast.  (Patient not taking: Reported on 08/30/2017)  . ibuprofen (ADVIL,MOTRIN) 800 MG tablet Take 1 tablet (800 mg total) by mouth every 8 (eight) hours as needed.   No facility-administered encounter medications on file as of 11/21/2017.     Surgical History: History reviewed. No pertinent surgical history.  Medical History: Past Medical History:  Diagnosis Date  . ADHD   . Atopic rhinitis     Family History: Family History  Problem Relation Age of Onset  . Thrombocytopenia Mother   . Hypertension Father   . Hyperlipidemia Father   . Diabetes Neg Hx   . Stroke Neg Hx     Social History   Socioeconomic History  . Marital status: Single    Spouse name: Not on file  . Number of children: Not on file  . Years of education: Not on file  . Highest education level: Not on file  Occupational History  . Not on file  Social Needs  . Financial resource strain: Not on file  . Food insecurity:    Worry: Not on file    Inability: Not on file  . Transportation needs:    Medical: Not on file    Non-medical: Not on file  Tobacco Use  . Smoking status: Former Smoker    Last attempt to quit: 03/21/2006    Years since quitting: 11.6  . Smokeless tobacco: Never Used  Substance and Sexual Activity  . Alcohol use: No  . Drug use: No  . Sexual activity: Yes    Birth control/protection: None, Condom  Lifestyle  . Physical activity:    Days per  week: Not on file    Minutes per session: Not on file  . Stress: Not on file  Relationships  . Social connections:    Talks on phone: Not on file    Gets together: Not on file    Attends religious service: Not on file    Active member of club or organization: Not on file    Attends meetings of clubs or organizations: Not on file    Relationship status: Not on file  . Intimate partner violence:    Fear of current or ex partner: Not on file    Emotionally abused: Not on file    Physically abused: Not on file    Forced sexual activity: Not on  file  Other Topics Concern  . Not on file  Social History Narrative  . Not on file      Review of Systems  Constitutional: Positive for fatigue. Negative for activity change and chills.  HENT: Negative for congestion, postnasal drip, rhinorrhea, sneezing and sore throat.   Eyes: Negative.  Negative for redness.  Respiratory: Negative for cough, chest tightness and wheezing.   Cardiovascular: Negative for chest pain and palpitations.  Gastrointestinal: Negative for abdominal pain and nausea.  Endocrine: Negative for cold intolerance, heat intolerance, polydipsia, polyphagia and polyuria.  Genitourinary: Negative for dysuria and frequency.       Menstrual cramps.  Musculoskeletal: Negative for arthralgias, back pain, myalgias and neck pain.  Skin: Negative for rash.  Allergic/Immunologic: Negative for environmental allergies.  Neurological: Negative for dizziness, tremors, numbness and headaches.  Hematological: Negative for adenopathy.  Psychiatric/Behavioral: Positive for decreased concentration. Negative for behavioral problems (Depression), sleep disturbance and suicidal ideas. The patient is not nervous/anxious.     Vital Signs: BP 104/76 (BP Location: Right Arm, Patient Position: Sitting, Cuff Size: Normal)   Pulse 92   Resp 16   Ht 5\' 7"  (1.702 m)   Wt 130 lb 12.8 oz (59.3 kg)   SpO2 97%   BMI 20.49 kg/m    Physical Exam  Constitutional: She is oriented to person, place, and time. She appears well-developed and well-nourished. No distress.  HENT:  Head: Normocephalic and atraumatic.  Nose: Nose normal.  Mouth/Throat: Oropharynx is clear and moist. No oropharyngeal exudate.  Eyes: Pupils are equal, round, and reactive to light. EOM are normal.  Neck: Normal range of motion. Neck supple. No JVD present. No tracheal deviation present. No thyromegaly present.  Cardiovascular: Normal rate, regular rhythm and normal heart sounds. Exam reveals no gallop.  No murmur  heard. Pulmonary/Chest: Effort normal and breath sounds normal. She has no rales. She exhibits no tenderness.  Abdominal: Soft. Bowel sounds are normal. There is no tenderness.  Musculoskeletal: Normal range of motion.  Lymphadenopathy:    She has no cervical adenopathy.  Neurological: She is alert and oriented to person, place, and time. No cranial nerve deficit.  Skin: Skin is warm and dry. She is not diaphoretic.  Psychiatric: She has a normal mood and affect. Her behavior is normal. Judgment and thought content normal.  Nursing note and vitals reviewed.   Assessment/Plan: 1. Menstrual cramps Ibuprofen 800mg  may be taken up to three times daily as needed for menstrual cramps.  - ibuprofen (ADVIL,MOTRIN) 800 MG tablet; Take 1 tablet (800 mg total) by mouth every 8 (eight) hours as needed.  Dispense: 90 tablet; Refill: 2  2. Attention deficit disorder (ADD) in adult May conitnue to take adderall 30mg  twice daily when needed. Three 30 day prescriptions  provided. Dates are 11/21/2017, 12/19/2017, and 01/17/2018 - amphetamine-dextroamphetamine (ADDERALL) 30 MG tablet; Take 1 tablet by mouth 2 (two) times daily.  Dispense: 60 tablet; Refill: 0 - amphetamine-dextroamphetamine (ADDERALL) 20 MG tablet; Take 1 tablet (20 mg total) by mouth 2 (two) times daily.  Dispense: 60 tablet; Refill: 0  General Counseling: Ayahna verbalizes understanding of the findings of todays visit and agrees with plan of treatment. I have discussed any further diagnostic evaluation that may be needed or ordered today. We also reviewed her medications today. she has been encouraged to call the office with any questions or concerns that should arise related to todays visit.   This patient was seen by Vincent Gros FNP Collaboration with Dr Lyndon Code as a part of collaborative care agreement  Meds ordered this encounter  Medications  . ibuprofen (ADVIL,MOTRIN) 800 MG tablet    Sig: Take 1 tablet (800 mg total) by mouth  every 8 (eight) hours as needed.    Dispense:  90 tablet    Refill:  2    Order Specific Question:   Supervising Provider    Answer:   Lyndon Code [[id number]]  . DISCONTD: amphetamine-dextroamphetamine (ADDERALL) 30 MG tablet    Sig: Take 1 tablet by mouth 2 (two) times daily.    Dispense:  60 tablet    Refill:  0    Order Specific Question:   Supervising Provider    Answer:   Lyndon Code [[id number]]  . DISCONTD: amphetamine-dextroamphetamine (ADDERALL) 30 MG tablet    Sig: Take 1 tablet by mouth 2 (two) times daily.    Dispense:  60 tablet    Refill:  0    Fill after 12/19/2017    Order Specific Question:   Supervising Provider    Answer:   Lyndon Code [[id number]]  . amphetamine-dextroamphetamine (ADDERALL) 30 MG tablet    Sig: Take 1 tablet by mouth 2 (two) times daily.    Dispense:  60 tablet    Refill:  0    Fill after 12/19/2017    Order Specific Question:   Supervising Provider    Answer:   Lyndon Code [[id number]]  . amphetamine-dextroamphetamine (ADDERALL) 20 MG tablet    Sig: Take 1 tablet (20 mg total) by mouth 2 (two) times daily.    Dispense:  60 tablet    Refill:  0    Please hold prescriptions for adderall 30mg  bid prn and fill when possible. Thanks.    Order Specific Question:   Supervising Provider    Answer:   Lyndon Code [[id number]]    Time spent: 45 Minutes      Dr Lyndon Code Internal medicine

## 2017-11-21 NOTE — Telephone Encounter (Signed)
Sent new rx for adderall 20mg  bid prn. Pharmacy should hold original prescriptions and fill when possible.

## 2017-11-22 DIAGNOSIS — N946 Dysmenorrhea, unspecified: Secondary | ICD-10-CM | POA: Insufficient documentation

## 2017-11-22 NOTE — Telephone Encounter (Signed)
Pt advised we send new pres to  [facility]

## 2018-02-22 ENCOUNTER — Encounter: Payer: Self-pay | Admitting: Nurse Practitioner

## 2018-02-22 ENCOUNTER — Ambulatory Visit: Payer: No Typology Code available for payment source | Admitting: Nurse Practitioner

## 2018-02-22 VITALS — BP 119/79 | HR 83 | Resp 16 | Ht 67.0 in | Wt 130.0 lb

## 2018-02-22 DIAGNOSIS — R5383 Other fatigue: Secondary | ICD-10-CM | POA: Diagnosis not present

## 2018-02-22 DIAGNOSIS — F988 Other specified behavioral and emotional disorders with onset usually occurring in childhood and adolescence: Secondary | ICD-10-CM | POA: Diagnosis not present

## 2018-02-22 MED ORDER — AMPHETAMINE-DEXTROAMPHETAMINE 30 MG PO TABS: 30.0000 mg | ORAL_TABLET | Freq: Two times a day (BID) | ORAL | 0 refills | Status: DC

## 2018-02-22 NOTE — Progress Notes (Signed)
[facility] [address] [place], [place] [address]  Internal MEDICINE  Office Visit Note  Patient Name: Carrie Schwartz  [id number]  [id number]  Date of Service: 02/22/2018  Chief Complaint  Patient presents with  . ADHD    The patient is currently taking adderall 30mg  twice daily when needed She has infant, works full time, and is now in nursing school. Doing well with this higher dosing. Does not need to take full dose all the time. Helps to keep her focused and on track. She is going to travel out of the country to visit her father-in-law, who is currently in [place] and on life support. She is unsure how long she will be out of the country. She would like to have her adderall filled as a single 90 day prescription if possible.        Current Medication: Outpatient Encounter Medications as of 02/22/2018  Medication Sig  . amphetamine-dextroamphetamine (ADDERALL) 30 MG tablet Take 1 tablet by mouth 2 (two) times daily.  . benzocaine-Menthol (DERMOPLAST) 20-0.5 % AERO Apply 1 application topically as needed for irritation (perineal discomfort).  Marland Kitchen ibuprofen (ADVIL,MOTRIN) 800 MG tablet Take 1 tablet (800 mg total) by mouth every 8 (eight) hours as needed.  . [DISCONTINUED] amphetamine-dextroamphetamine (ADDERALL) 20 MG tablet Take 1 tablet (20 mg total) by mouth 2 (two) times daily.  . [DISCONTINUED] amphetamine-dextroamphetamine (ADDERALL) 30 MG tablet Take 1 tablet by mouth 2 (two) times daily.  Marland Kitchen docusate sodium (COLACE) 100 MG capsule Take 1 capsule (100 mg total) by mouth 2 (two) times daily. (Patient not taking: Reported on 08/30/2017)  . ferrous sulfate 325 (65 FE) MG tablet Take 1 tablet (325 mg total) by mouth daily with breakfast. (Patient not taking: Reported on 08/30/2017)  . Prenatal Vit-Fe Fumarate-FA (MULTIVITAMIN-PRENATAL) 27-0.8 MG TABS tablet Take 1 tablet by mouth daily at 12 noon.   No facility-administered encounter medications on file as of 02/22/2018.      Surgical History: History reviewed. No pertinent surgical history.  Medical History: Past Medical History:  Diagnosis Date  . ADHD   . Atopic rhinitis     Family History: Family History  Problem Relation Age of Onset  . Thrombocytopenia Mother   . Hypertension Father   . Hyperlipidemia Father   . Diabetes Neg Hx   . Stroke Neg Hx     Social History   Socioeconomic History  . Marital status: Single    Spouse name: Not on file  . Number of children: Not on file  . Years of education: Not on file  . Highest education level: Not on file  Occupational History  . Not on file  Social Needs  . Financial resource strain: Not on file  . Food insecurity:    Worry: Not on file    Inability: Not on file  . Transportation needs:    Medical: Not on file    Non-medical: Not on file  Tobacco Use  . Smoking status: Former Smoker    Last attempt to quit: 03/21/2006    Years since quitting: 11.9  . Smokeless tobacco: Never Used  Substance and Sexual Activity  . Alcohol use: No  . Drug use: No  . Sexual activity: Yes    Birth control/protection: None, Condom  Lifestyle  . Physical activity:    Days per week: Not on file    Minutes per session: Not on file  . Stress: Not on file  Relationships  . Social connections:  Talks on phone: Not on file    Gets together: Not on file    Attends religious service: Not on file    Active member of club or organization: Not on file    Attends meetings of clubs or organizations: Not on file    Relationship status: Not on file  . Intimate partner violence:    Fear of current or ex partner: Not on file    Emotionally abused: Not on file    Physically abused: Not on file    Forced sexual activity: Not on file  Other Topics Concern  . Not on file  Social History Narrative  . Not on file      Review of Systems  Constitutional: Negative for activity change, chills and fatigue.  HENT: Negative for congestion, postnasal drip,  rhinorrhea, sneezing and sore throat.   Eyes: Negative.  Negative for redness.  Respiratory: Negative for cough, chest tightness and wheezing.   Cardiovascular: Negative for chest pain and palpitations.  Gastrointestinal: Negative for abdominal pain and nausea.  Endocrine: Negative for cold intolerance, heat intolerance, polydipsia, polyphagia and polyuria.  Genitourinary: Negative for dysuria and frequency.  Musculoskeletal: Negative for arthralgias, back pain, myalgias and neck pain.  Skin: Negative for rash.  Allergic/Immunologic: Negative for environmental allergies.  Neurological: Negative for dizziness, tremors, numbness and headaches.  Hematological: Negative for adenopathy.  Psychiatric/Behavioral: Positive for decreased concentration. Negative for behavioral problems (Depression), sleep disturbance and suicidal ideas. The patient is not nervous/anxious.     Vital Signs: BP 119/79 (BP Location: Right Arm, Patient Position: Sitting)   Pulse 83   Resp 16   Ht 5\' 7"  (1.702 m)   Wt 130 lb (59 kg)   SpO2 100%   BMI 20.36 kg/m    Physical Exam  Constitutional: She is oriented to person, place, and time. She appears well-developed and well-nourished. No distress.  HENT:  Head: Normocephalic and atraumatic.  Mouth/Throat: No oropharyngeal exudate.  Eyes: Pupils are equal, round, and reactive to light. EOM are normal.  Neck: Normal range of motion. Neck supple. No JVD present. No tracheal deviation present. No thyromegaly present.  Cardiovascular: Normal rate, regular rhythm and normal heart sounds. Exam reveals no gallop.  No murmur heard. Pulmonary/Chest: Effort normal and breath sounds normal. She has no rales. She exhibits no tenderness.  Abdominal: There is no tenderness.  Lymphadenopathy:    She has no cervical adenopathy.  Neurological: She is alert and oriented to person, place, and time. No cranial nerve deficit.  Skin: She is not diaphoretic.  Psychiatric: She has a  normal mood and affect. Her behavior is normal. Judgment and thought content normal.  Nursing note and vitals reviewed.  Assessment/Plan: 1. Other fatigue Stable and due to busy lifestyle with young child, work, and full-time school   2. Attention deficit disorder (ADD) in adult Refill adderall 30mg  up to twice daily when needed. Sent a single 90 day prescription to her pharmacy - amphetamine-dextroamphetamine (ADDERALL) 30 MG tablet; Take 1 tablet by mouth 2 (two) times daily.  Dispense: 180 tablet; Refill: 0  General Counseling: Lynell verbalizes understanding of the findings of todays visit and agrees with plan of treatment. I have discussed any further diagnostic evaluation that may be needed or ordered today. We also reviewed her medications today. she has been encouraged to call the office with any questions or concerns that should arise related to todays visit.  Refilled Controlled medications today. Reviewed risks and possible side effects associated with taking  Stimulants. Combination of these drugs with other psychotropic medications could cause dizziness and drowsiness. Pt needs to Monitor symptoms and exercise caution in driving and operating heavy machinery to avoid damages to oneself, to others and to the surroundings. Patient verbalized understanding in this matter. Dependence and abuse for these drugs will be monitored closely. A Controlled substance policy and procedure is on file which allows [facility] to order a urine drug screen test at any visit. Patient understands and agrees with the plan..  This patient was seen by Vincent GrosHeather Sienna Stonehocker FNP Collaboration with Dr Lyndon CodeFozia M Khan as a part of collaborative care agreement    Meds ordered this encounter  Medications  . amphetamine-dextroamphetamine (ADDERALL) 30 MG tablet    Sig: Take 1 tablet by mouth 2 (two) times daily.    Dispense:  180 tablet    Refill:  0    Please fill as single 90 day prescription if possible.     Order Specific Question:   Supervising Provider    Answer:   Lyndon CodeKHAN, FOZIA M [[id number]]    Time spent: 5815 Minutes      Dr Lyndon CodeFozia M Khan Internal medicine

## 2018-03-05 ENCOUNTER — Telehealth: Payer: Self-pay

## 2018-03-05 ENCOUNTER — Other Ambulatory Visit: Payer: Self-pay

## 2018-03-05 ENCOUNTER — Other Ambulatory Visit: Payer: Self-pay | Admitting: Nurse Practitioner

## 2018-03-05 DIAGNOSIS — F988 Other specified behavioral and emotional disorders with onset usually occurring in childhood and adolescence: Secondary | ICD-10-CM

## 2018-03-05 MED ORDER — AMPHETAMINE-DEXTROAMPHETAMINE 30 MG PO TABS: 30.0000 mg | ORAL_TABLET | Freq: Two times a day (BID) | ORAL | 0 refills | Status: AC

## 2018-03-05 NOTE — Telephone Encounter (Signed)
Cancelled adderall prescription to [facility]. Sent new 90 day prescription to [facility] in [place].

## 2018-03-05 NOTE — Progress Notes (Signed)
Cancelled adderall prescription to [facility]. Sent new 90 day prescription to [facility] in [place].

## 2018-03-06 NOTE — Telephone Encounter (Signed)
Pt advised we canceled pres to [facility] and send to [facility] in [place]

## 2018-05-24 ENCOUNTER — Ambulatory Visit: Payer: Self-pay | Admitting: Nurse Practitioner
